# Patient Record
Sex: Female | Born: 1935 | Race: White | Hispanic: No | Smoking: Never smoker
Health system: Southern US, Community
[De-identification: ages and names within clinical notes are randomized; demographics above are authoritative.]

## PROBLEM LIST (undated history)

## (undated) DIAGNOSIS — G8929 Other chronic pain: Secondary | ICD-10-CM

## (undated) DIAGNOSIS — E785 Hyperlipidemia, unspecified: Secondary | ICD-10-CM

## (undated) DIAGNOSIS — I10 Essential (primary) hypertension: Secondary | ICD-10-CM

## (undated) DIAGNOSIS — M549 Dorsalgia, unspecified: Secondary | ICD-10-CM

## (undated) HISTORY — PX: TONSILLECTOMY: SUR1361

## (undated) HISTORY — PX: ABDOMINAL HYSTERECTOMY: SHX81

---

## 2017-09-16 ENCOUNTER — Other Ambulatory Visit: Payer: Self-pay

## 2017-09-16 ENCOUNTER — Emergency Department (HOSPITAL_BASED_OUTPATIENT_CLINIC_OR_DEPARTMENT_OTHER): Payer: Medicare Other

## 2017-09-16 ENCOUNTER — Inpatient Hospital Stay (HOSPITAL_BASED_OUTPATIENT_CLINIC_OR_DEPARTMENT_OTHER)
Admission: EM | Admit: 2017-09-16 | Discharge: 2017-09-18 | DRG: 185 | Disposition: A | Discharge status: Home / Self Care | Payer: Medicare Other | Attending: General Surgery | Admitting: General Surgery

## 2017-09-16 ENCOUNTER — Encounter (HOSPITAL_BASED_OUTPATIENT_CLINIC_OR_DEPARTMENT_OTHER): Payer: Self-pay | Admitting: *Deleted

## 2017-09-16 DIAGNOSIS — W01198A Fall on same level from slipping, tripping and stumbling with subsequent striking against other object, initial encounter: Secondary | ICD-10-CM | POA: Diagnosis present

## 2017-09-16 DIAGNOSIS — Y93H2 Activity, gardening and landscaping: Secondary | ICD-10-CM | POA: Diagnosis not present

## 2017-09-16 DIAGNOSIS — Z881 Allergy status to other antibiotic agents status: Secondary | ICD-10-CM

## 2017-09-16 DIAGNOSIS — Z888 Allergy status to other drugs, medicaments and biological substances status: Secondary | ICD-10-CM

## 2017-09-16 DIAGNOSIS — M549 Dorsalgia, unspecified: Secondary | ICD-10-CM | POA: Diagnosis not present

## 2017-09-16 DIAGNOSIS — S2241XA Multiple fractures of ribs, right side, initial encounter for closed fracture: Principal | ICD-10-CM | POA: Diagnosis present

## 2017-09-16 DIAGNOSIS — D329 Benign neoplasm of meninges, unspecified: Secondary | ICD-10-CM | POA: Diagnosis not present

## 2017-09-16 DIAGNOSIS — M5126 Other intervertebral disc displacement, lumbar region: Secondary | ICD-10-CM | POA: Diagnosis present

## 2017-09-16 DIAGNOSIS — I10 Essential (primary) hypertension: Secondary | ICD-10-CM | POA: Diagnosis present

## 2017-09-16 DIAGNOSIS — S2249XA Multiple fractures of ribs, unspecified side, initial encounter for closed fracture: Secondary | ICD-10-CM | POA: Diagnosis present

## 2017-09-16 DIAGNOSIS — G8929 Other chronic pain: Secondary | ICD-10-CM | POA: Diagnosis not present

## 2017-09-16 DIAGNOSIS — Z79899 Other long term (current) drug therapy: Secondary | ICD-10-CM

## 2017-09-16 DIAGNOSIS — R7303 Prediabetes: Secondary | ICD-10-CM | POA: Diagnosis not present

## 2017-09-16 DIAGNOSIS — Y92096 Garden or yard of other non-institutional residence as the place of occurrence of the external cause: Secondary | ICD-10-CM

## 2017-09-16 DIAGNOSIS — S81812A Laceration without foreign body, left lower leg, initial encounter: Secondary | ICD-10-CM | POA: Diagnosis present

## 2017-09-16 HISTORY — DX: Other chronic pain: G89.29

## 2017-09-16 HISTORY — DX: Hyperlipidemia, unspecified: E78.5

## 2017-09-16 HISTORY — DX: Dorsalgia, unspecified: M54.9

## 2017-09-16 HISTORY — DX: Essential (primary) hypertension: I10

## 2017-09-16 LAB — CBC WITH DIFFERENTIAL/PLATELET
Basophils Absolute: 0 10*3/uL (ref 0.0–0.1)
Basophils Relative: 0 %
EOS ABS: 0.2 10*3/uL (ref 0.0–0.7)
EOS PCT: 2 %
HCT: 44.5 % (ref 36.0–46.0)
Hemoglobin: 14.2 g/dL (ref 12.0–15.0)
LYMPHS ABS: 1.7 10*3/uL (ref 0.7–4.0)
Lymphocytes Relative: 19 %
MCH: 27.6 pg (ref 26.0–34.0)
MCHC: 31.9 g/dL (ref 30.0–36.0)
MCV: 86.6 fL (ref 78.0–100.0)
MONO ABS: 1.1 10*3/uL — AB (ref 0.1–1.0)
Monocytes Relative: 12 %
Neutro Abs: 5.8 10*3/uL (ref 1.7–7.7)
Neutrophils Relative %: 67 %
PLATELETS: 305 10*3/uL (ref 150–400)
RBC: 5.14 MIL/uL — AB (ref 3.87–5.11)
RDW: 15.6 % — AB (ref 11.5–15.5)
WBC: 8.7 10*3/uL (ref 4.0–10.5)

## 2017-09-16 LAB — COMPREHENSIVE METABOLIC PANEL
ALT: 27 U/L (ref 0–44)
ANION GAP: 5 (ref 5–15)
AST: 25 U/L (ref 15–41)
Albumin: 4.5 g/dL (ref 3.5–5.0)
Alkaline Phosphatase: 68 U/L (ref 38–126)
BUN: 25 mg/dL — ABNORMAL HIGH (ref 8–23)
CHLORIDE: 103 mmol/L (ref 98–111)
CO2: 34 mmol/L — AB (ref 22–32)
Calcium: 9.7 mg/dL (ref 8.9–10.3)
Creatinine, Ser: 1.12 mg/dL — ABNORMAL HIGH (ref 0.44–1.00)
GFR, EST AFRICAN AMERICAN: 52 mL/min — AB (ref >60)
GFR, EST NON AFRICAN AMERICAN: 44 mL/min — AB (ref >60)
Glucose, Bld: 95 mg/dL (ref 70–99)
POTASSIUM: 4.7 mmol/L (ref 3.5–5.1)
SODIUM: 142 mmol/L (ref 135–145)
Total Bilirubin: 1.2 mg/dL (ref 0.3–1.2)
Total Protein: 7.8 g/dL (ref 6.5–8.1)

## 2017-09-16 MED ORDER — OXYCODONE HCL 5 MG PO TABS: 2.5000 mg | ORAL_TABLET | ORAL | Status: DC | PRN

## 2017-09-16 MED ORDER — GABAPENTIN 400 MG PO CAPS
400.0000 mg | ORAL_CAPSULE | Freq: Every morning | ORAL | Status: DC
  Administered 2017-09-17 – 2017-09-18 (×2): 400 mg via ORAL
  Filled 2017-09-16 (×2): qty 1

## 2017-09-16 MED ORDER — ONDANSETRON 4 MG PO TBDP: 4.0000 mg | ORAL_TABLET | Freq: Four times a day (QID) | ORAL | Status: DC | PRN

## 2017-09-16 MED ORDER — HYDROCODONE-ACETAMINOPHEN 5-325 MG PO TABS
1.0000 | ORAL_TABLET | Freq: Once | ORAL | Status: AC
  Administered 2017-09-16: 1 via ORAL
  Filled 2017-09-16: qty 1

## 2017-09-16 MED ORDER — DOCUSATE SODIUM 100 MG PO CAPS
100.0000 mg | ORAL_CAPSULE | Freq: Two times a day (BID) | ORAL | Status: DC
  Administered 2017-09-17 – 2017-09-18 (×4): 100 mg via ORAL
  Filled 2017-09-16 (×4): qty 1

## 2017-09-16 MED ORDER — FENTANYL CITRATE (PF) 100 MCG/2ML IJ SOLN
50.0000 ug | Freq: Once | INTRAMUSCULAR | Status: AC
  Administered 2017-09-16: 50 ug via INTRAVENOUS
  Filled 2017-09-16: qty 2

## 2017-09-16 MED ORDER — POTASSIUM CHLORIDE IN NACL 20-0.9 MEQ/L-% IV SOLN
INTRAVENOUS | Status: DC
  Administered 2017-09-16: via INTRAVENOUS
  Filled 2017-09-16: qty 1000

## 2017-09-16 MED ORDER — LISINOPRIL 10 MG PO TABS
10.0000 mg | ORAL_TABLET | Freq: Every day | ORAL | Status: DC
  Administered 2017-09-17 – 2017-09-18 (×2): 10 mg via ORAL
  Filled 2017-09-16 (×2): qty 1

## 2017-09-16 MED ORDER — ENOXAPARIN SODIUM 40 MG/0.4ML ~~LOC~~ SOLN
40.0000 mg | Freq: Every day | SUBCUTANEOUS | Status: DC
  Administered 2017-09-17 – 2017-09-18 (×2): 40 mg via SUBCUTANEOUS
  Filled 2017-09-16 (×3): qty 0.4

## 2017-09-16 MED ORDER — OXYCODONE HCL 5 MG PO TABS: 5.0000 mg | ORAL_TABLET | ORAL | Status: DC | PRN

## 2017-09-16 MED ORDER — FENTANYL CITRATE (PF) 100 MCG/2ML IJ SOLN
50.0000 ug | INTRAMUSCULAR | Status: DC | PRN
  Administered 2017-09-16: 50 ug via INTRAVENOUS
  Filled 2017-09-16: qty 2

## 2017-09-16 MED ORDER — ONDANSETRON HCL 4 MG/2ML IJ SOLN
4.0000 mg | Freq: Once | INTRAMUSCULAR | Status: AC
  Administered 2017-09-16: 4 mg via INTRAVENOUS
  Filled 2017-09-16: qty 2

## 2017-09-16 MED ORDER — ACETAMINOPHEN 325 MG PO TABS
650.0000 mg | ORAL_TABLET | Freq: Four times a day (QID) | ORAL | Status: DC
  Administered 2017-09-17 – 2017-09-18 (×6): 650 mg via ORAL
  Filled 2017-09-16 (×6): qty 2

## 2017-09-16 MED ORDER — IOPAMIDOL (ISOVUE-300) INJECTION 61%
100.0000 mL | Freq: Once | INTRAVENOUS | Status: AC | PRN
  Administered 2017-09-16: 64 mL via INTRAVENOUS

## 2017-09-16 MED ORDER — MORPHINE SULFATE (PF) 2 MG/ML IV SOLN
1.0000 mg | INTRAVENOUS | Status: DC | PRN
  Administered 2017-09-16: 1 mg via INTRAVENOUS
  Filled 2017-09-16: qty 1

## 2017-09-16 MED ORDER — DIPHENHYDRAMINE HCL 50 MG/ML IJ SOLN: 12.5000 mg | Freq: Three times a day (TID) | INTRAMUSCULAR | Status: DC | PRN

## 2017-09-16 MED ORDER — GABAPENTIN 400 MG PO CAPS
800.0000 mg | ORAL_CAPSULE | Freq: Every day | ORAL | Status: DC
  Administered 2017-09-17 (×2): 800 mg via ORAL
  Filled 2017-09-16 (×2): qty 2

## 2017-09-16 MED ORDER — ONDANSETRON HCL 4 MG/2ML IJ SOLN: 4.0000 mg | Freq: Four times a day (QID) | INTRAMUSCULAR | Status: DC | PRN

## 2017-09-16 NOTE — H&P (Signed)
Debbie Sloan is an 84 y.o. female.   Chief Complaint: I fell HPI: 84 year old female with hypertension, hyperlipidemia, chronic back pain transferred from [place] for multiple rib fractures after sustaining a ground-level fall.  She was outside talking with her neighbor and watering her plants and got distracted and tripped.  She fell on her right side.  She did not hit her head or lose consciousness.  She had a small laceration to her left lower leg.  She was unable to get herself up.  She said a neighbor and the neighbor saw her helped her get up and took her to the emergency room for evaluation.  She denies any acute neck pain or back pain.  She does take some gabapentin for her herniated disc.  She states her doctor is trying to wean her gabapentin dosage.  She denies any tobacco, alcohol or drugs.  She denies any abdominal pain, vision change or extremity pain other than tenderness around the laceration.  Steri-Strips were placed over the laceration at the ER.  She was transferred here for observation due to her multiple rib fractures.  She is followed by [facility] for meningioma, lumbar disc herniation  She states that she has had 2 ground-level falls in the past couple months.  She states they were both careless errors.   Past Medical History:  Diagnosis Date  . Chronic back pain   . Hyperlipidemia   . Hypertension   Prediabetes   Past Surgical History:  Procedure Laterality Date  . ABDOMINAL HYSTERECTOMY    . TONSILLECTOMY    EGD with dilation   History reviewed. No pertinent family history. Social History:  reports that she has never smoked. She has never used smokeless tobacco. She reports that she drank alcohol. She reports that she has current or past drug history.  Allergies:  Allergies  Allergen Reactions  . Atorvastatin   . Simvastatin   . Tetracyclines & Related     Medications Prior to Admission  Medication Sig Dispense Refill  . gabapentin (NEURONTIN)  800 MG tablet Take 800 mg by mouth 3 (three) times daily.    Marland Kitchen lisinopril (PRINIVIL,ZESTRIL) 10 MG tablet Take 10 mg by mouth daily.    Marland Kitchen LORazepam (ATIVAN) 0.5 MG tablet Take 0.5 mg by mouth every 8 (eight) hours.      Results for orders placed or performed during the hospital encounter of 09/16/17 (from the past 48 hour(s))  CBC with Differential     Status: Abnormal   Collection Time: 09/16/17  2:54 PM  Result Value Ref Range   WBC 8.7 4.0 - 10.5 K/uL   RBC 5.14 (H) 3.87 - 5.11 MIL/uL   Hemoglobin 14.2 12.0 - 15.0 g/dL   HCT 44.5 36.0 - 46.0 %   MCV 86.6 78.0 - 100.0 fL   MCH 27.6 26.0 - 34.0 pg   MCHC 31.9 30.0 - 36.0 g/dL   RDW 15.6 (H) 11.5 - 15.5 %   Platelets 305 150 - 400 K/uL   Neutrophils Relative % 67 %   Neutro Abs 5.8 1.7 - 7.7 K/uL   Lymphocytes Relative 19 %   Lymphs Abs 1.7 0.7 - 4.0 K/uL   Monocytes Relative 12 %   Monocytes Absolute 1.1 (H) 0.1 - 1.0 K/uL   Eosinophils Relative 2 %   Eosinophils Absolute 0.2 0.0 - 0.7 K/uL   Basophils Relative 0 %   Basophils Absolute 0.0 0.0 - 0.1 K/uL    Comment: Performed at [facility], [address] [place], [place] [address]  Comprehensive metabolic panel     Status: Abnormal   Collection Time: 09/16/17  2:54 PM  Result Value Ref Range   Sodium 142 135 - 145 mmol/L   Potassium 4.7 3.5 - 5.1 mmol/L   Chloride 103 98 - 111 mmol/L   CO2 34 (H) 22 - 32 mmol/L   Glucose, Bld 95 70 - 99 mg/dL   BUN 25 (H) 8 - 23 mg/dL   Creatinine, Ser 1.12 (H) 0.44 - 1.00 mg/dL   Calcium 9.7 8.9 - 10.3 mg/dL   Total Protein 7.8 6.5 - 8.1 g/dL   Albumin 4.5 3.5 - 5.0 g/dL   AST 25 15 - 41 U/L   ALT 27 0 - 44 U/L   Alkaline Phosphatase 68 38 - 126 U/L   Total Bilirubin 1.2 0.3 - 1.2 mg/dL   GFR calc non Af Amer 44 (L) >60 mL/min   GFR calc Af Amer 52 (L) >60 mL/min    Comment: (NOTE) The eGFR has been calculated using the CKD EPI equation. This calculation has not been validated in all clinical situations. eGFR's  persistently <60 mL/min signify possible Chronic Kidney Disease.    Anion gap 5 5 - 15    Comment: Performed at [facility], [place], [place] [address]   Dg Ribs Unilateral W/chest Right  Result Date: 09/16/2017 CLINICAL DATA:  Right rib pain after fall. EXAM: RIGHT RIBS AND CHEST - 3+ VIEW COMPARISON:  None. FINDINGS: Mildly displaced fractures are seen involving the lateral portions of the right fifth and sixth ribs. There is no evidence of pneumothorax or pleural effusion. Both lungs are clear. Heart size and mediastinal contours are within normal limits. IMPRESSION: Mildly displaced right fifth and sixth rib fractures. No acute cardiopulmonary abnormality is noted. Electronically Signed   By: Marijo Conception, M.D.   On: 09/16/2017 13:48   Ct Chest W Contrast  Result Date: 09/16/2017 CLINICAL DATA:  Right rib fractures on chest radiographs from earlier today. Fall today. EXAM: CT CHEST WITH CONTRAST TECHNIQUE: Multidetector CT imaging of the chest was performed during intravenous contrast administration. CONTRAST:  28m ISOVUE-300 IOPAMIDOL (ISOVUE-300) INJECTION 61% COMPARISON:  Chest radiograph from earlier today. FINDINGS: Cardiovascular: Normal heart size. No significant pericardial effusion/thickening. Left anterior descending coronary atherosclerosis. Atherosclerotic nonaneurysmal thoracic aorta. Dilated main pulmonary artery (3.5 cm diameter). No central pulmonary emboli. Mediastinum/Nodes: No discrete thyroid nodules. Unremarkable esophagus. No pathologically enlarged axillary, mediastinal or hilar lymph nodes. Lungs/Pleura: No pneumothorax. No pleural effusion. Subcentimeter calcified left lower lobe granuloma. Subpleural 4 mm right middle lobe solid pulmonary nodule associated with the minor fissure (series 3/image 85). Small parenchymal bands at the right greater than left lung bases, which could represent hypoventilatory change and/or  postinfectious/postinflammatory scarring. Upper abdomen: Simple left liver lobe cysts, largest 2.6 cm. Musculoskeletal: No aggressive appearing focal osseous lesions. There are acute fractures of the lateral right fourth, fifth, sixth, seventh, eighth, ninth and tenth ribs, which are mildly displaced in the fifth and sixth ribs. Mild thoracic spondylosis. IMPRESSION: 1. Acute fractures of the lateral right fourth through tenth ribs. No pneumothorax. No pleural effusions. 2. Small parenchymal bands at the right greater than left lung bases, which could represent hypoventilatory change and/or nonspecific scarring. 3. Dilated main pulmonary artery, suggesting pulmonary arterial hypertension 4. One vessel coronary atherosclerosis. Aortic Atherosclerosis (ICD10-I70.0). Electronically Signed   By: JIlona SorrelM.D.   On:  09/16/2017 16:33    Review of Systems  Constitutional: Negative for weight loss.  HENT: Negative for nosebleeds.   Eyes: Negative for blurred vision.  Respiratory: Negative for shortness of breath.   Cardiovascular: Positive for chest pain (chest wall pain). Negative for palpitations, orthopnea and PND.       Denies DOE  Gastrointestinal: Negative for abdominal pain and vomiting.  Genitourinary: Negative for dysuria and hematuria.  Musculoskeletal: Positive for back pain (herniated disc) and falls (2 previous falls in past several months).  Skin: Negative for itching and rash.  Neurological: Negative for dizziness, focal weakness, seizures, loss of consciousness and headaches.       Denies TIAs, amaurosis fugax  Endo/Heme/Allergies: Does not bruise/bleed easily.  Psychiatric/Behavioral: The patient is not nervous/anxious.     Blood pressure (!) 143/91, pulse 62, temperature 98.3 F (36.8 C), temperature source Oral, resp. rate 18, height 5' 4"  (1.626 m), weight 64.9 kg, SpO2 97 %. Physical Exam  Vitals reviewed. Constitutional: She is oriented to person, place, and time. She  appears well-developed and well-nourished. No distress.  HENT:  Head: Normocephalic and atraumatic.  Right Ear: External ear normal.  Left Ear: External ear normal.  Eyes: Conjunctivae and EOM are normal. No scleral icterus.  Neck: Normal range of motion. Neck supple. No tracheal deviation present. No thyromegaly present.  Cardiovascular: Normal rate, normal heart sounds and intact distal pulses.  No murmur heard. Respiratory: Effort normal and breath sounds normal. No stridor. No respiratory distress. She has no wheezes. She exhibits tenderness.  Able to pull 1400-1500 on IS  GI: Soft. She exhibits no distension. There is no tenderness. There is no rebound.  Musculoskeletal: She exhibits no edema or tenderness.  Lymphadenopathy:    She has no cervical adenopathy.  Neurological: She is alert and oriented to person, place, and time. She exhibits normal muscle tone.  Skin: Skin is warm and dry. No rash noted. She is not diaphoretic. No erythema. No pallor.  Bandaged Left lower leg shin - steristrips over lac.   Psychiatric: She has a normal mood and affect. Her behavior is normal. Judgment and thought content normal.     Assessment/Plan Fall Right rib fxs 4-10 HTN Chronic back pain/lumbar disc herniation Prediabetes HPL  Discussed rib fx and mgmt with pt Pain control - multimodality pain control - scheduled tylenol, cont home gabapentin, PRN oxycodone, breakthru morphine Cont home BP med Pharm to verify meds pulm toilet - IS, OOB, ambulate PT/OT consult in AM  Leighton Ruff. Redmond Pulling, MD, FACS General, Bariatric, & Minimally Invasive Surgery [facility], [place]   Greer Pickerel, MD 09/16/2017, 11:23 PM

## 2017-09-16 NOTE — ED Triage Notes (Signed)
Pt c/o fall this am on bricks , c/o right rib pain and left lower leg abrasion .

## 2017-09-16 NOTE — ED Notes (Signed)
[facility] arrived to transport [facility] to [facility] 

## 2017-09-16 NOTE — ED Provider Notes (Signed)
[facility] EMERGENCY DEPARTMENT Provider Note   CSN: [id number] Arrival date & time: 09/16/17  1314     History   Chief Complaint Chief Complaint  Patient presents with  . Fall    HPI Debbie Sloan is a 84 y.o. female.  HPI Debbie Sloan is a 84 y.o. female presents to emergency department with complaint of a fall.  Patient states she was watering her flowers this morning, when she tripped and fell hitting her right ribs on a brick retaining wall.  She did not hit her head or lost consciousness.  She denies any other injuries other than laceration to the left lower leg.  She states her next door neighbor saw her fall and helped her get up and brought her here.  Leg wound was cleaned and Band-Aid applied.  She states pain in the right ribs is worse with movement and taking deep breath.  She denies any abdominal pain.  No neck or back pain.  No numbness or weakness in extremities.  Able to ambulate with no difficulty.  Did not take any medications prior to coming in  Past Medical History:  Diagnosis Date  . Chronic back pain   . Hyperlipidemia   . Hypertension     There are no active problems to display for this patient.   Past Surgical History:  Procedure Laterality Date  . ABDOMINAL HYSTERECTOMY    . TONSILLECTOMY       OB History   None      Home Medications    Prior to Admission medications   Medication Sig Start Date End Date Taking? Authorizing Provider  gabapentin (NEURONTIN) 800 MG tablet Take 800 mg by mouth 3 (three) times daily.   Yes [provider]  lisinopril (PRINIVIL,ZESTRIL) 10 MG tablet Take 10 mg by mouth daily.   Yes [provider]  LORazepam (ATIVAN) 0.5 MG tablet Take 0.5 mg by mouth every 8 (eight) hours.   Yes [provider]    Family History History reviewed. No pertinent family history.  Social History Social History   Tobacco Use  . Smoking status: Never Smoker  . Smokeless tobacco: Never  Used  Substance Use Topics  . Alcohol use: Not Currently  . Drug use: Not Currently     Allergies   Atorvastatin; Simvastatin; and Tetracyclines & related   Review of Systems Review of Systems  Constitutional: Negative for chills and fever.  Respiratory: Negative for cough, chest tightness and shortness of breath.   Cardiovascular: Positive for chest pain. Negative for palpitations and leg swelling.  Gastrointestinal: Negative for abdominal pain, diarrhea, nausea and vomiting.  Genitourinary: Negative for dysuria, flank pain, pelvic pain, vaginal bleeding, vaginal discharge and vaginal pain.  Musculoskeletal: Negative for arthralgias, myalgias, neck pain and neck stiffness.  Skin: Negative for rash.  Neurological: Negative for dizziness, weakness and headaches.  All other systems reviewed and are negative.    Physical Exam Updated Vital Signs BP (!) 141/57 (BP Location: Left Arm)   Pulse 64   Temp 98.4 F (36.9 C) (Oral)   Resp 18   Ht 5\' 4"  (1.626 m)   Wt 64.9 kg   SpO2 98%   BMI 24.55 kg/m   Physical Exam  Constitutional: She appears well-developed and well-nourished. No distress.  HENT:  Head: Normocephalic.  Eyes: Conjunctivae are normal.  Neck: Neck supple.  Cardiovascular: Normal rate, regular rhythm and normal heart sounds.  Pulmonary/Chest: Effort normal and breath sounds normal. No respiratory distress. She  has no wheezes. She has no rales. She exhibits tenderness.  Right lower posterior, lateral, anterior rib tenderness.  No deformity, no bruising, no flail chest, no crepitus  Abdominal: Soft. Bowel sounds are normal. She exhibits no distension. There is no tenderness. There is no rebound.  Musculoskeletal: She exhibits no edema.  2cm laceration to the left anterior tib fib. Hemostatic.   Neurological: She is alert.  Skin: Skin is warm and dry.  Psychiatric: She has a normal mood and affect. Her behavior is normal.  Nursing note and vitals  reviewed.    ED Treatments / Results  Labs (all labs ordered are listed, but only abnormal results are displayed) Labs Reviewed - No data to display  EKG None  Radiology Dg Ribs Unilateral W/chest Right  Result Date: 09/16/2017 CLINICAL DATA:  Right rib pain after fall. EXAM: RIGHT RIBS AND CHEST - 3+ VIEW COMPARISON:  None. FINDINGS: Mildly displaced fractures are seen involving the lateral portions of the right fifth and sixth ribs. There is no evidence of pneumothorax or pleural effusion. Both lungs are clear. Heart size and mediastinal contours are within normal limits. IMPRESSION: Mildly displaced right fifth and sixth rib fractures. No acute cardiopulmonary abnormality is noted. Electronically Signed   By: Marijo Conception, M.D.   On: 09/16/2017 13:48    Procedures Procedures (including critical care time)  Medications Ordered in ED Medications  fentaNYL (SUBLIMAZE) injection 50 mcg (has no administration in time range)  HYDROcodone-acetaminophen (NORCO/VICODIN) 5-325 MG per tablet 1 tablet (1 tablet Oral Given 09/16/17 1543)  iopamidol (ISOVUE-300) 61 % injection 100 mL (64 mLs Intravenous Contrast Given 09/16/17 1556)  fentaNYL (SUBLIMAZE) injection 50 mcg (50 mcg Intravenous Given 09/16/17 1707)  ondansetron (ZOFRAN) injection 4 mg (4 mg Intravenous Given 09/16/17 1707)     Initial Impression / Assessment and Plan / ED Course  I have reviewed the triage vital signs and the nursing notes.  Pertinent labs & imaging results that were available during my care of the patient were reviewed by me and considered in my medical decision making (see chart for details).     Pt after a mechanical fall, mainly complaining of right lower rib injury. Xray showing 5th and 6th rib fractures. Will get labs and ct for further evaluation.   Pt's CT showed ribs 4-10 fractures. Pt continues to be neurovascularly intact. Comfortable with fentanyl. Spoke with Dr. Grandville Silos with trauma, accepted pt  for admission to trauma service.   Vitals:   09/16/17 1715 09/16/17 1730 09/16/17 1745 09/16/17 1900  BP: (!) 174/62 (!) 173/60 (!) 165/64 (!) 135/46  Pulse: 74 75 77 68  Resp: 16  16 18   Temp:      TempSrc:      SpO2: 100% 97% 98% 100%  Weight:      Height:         Final Clinical Impressions(s) / ED Diagnoses   Final diagnoses:  Closed fracture of multiple ribs of right side, initial encounter    ED Discharge Orders    None       Jeannett Senior, PA-C 09/16/17 1900    Malvin Johns, MD 09/17/17 [phone]

## 2017-09-17 ENCOUNTER — Inpatient Hospital Stay (HOSPITAL_COMMUNITY): Payer: Medicare Other

## 2017-09-17 ENCOUNTER — Encounter (HOSPITAL_COMMUNITY): Payer: Self-pay | Admitting: *Deleted

## 2017-09-17 DIAGNOSIS — S2241XA Multiple fractures of ribs, right side, initial encounter for closed fracture: Secondary | ICD-10-CM | POA: Diagnosis not present

## 2017-09-17 LAB — BASIC METABOLIC PANEL
ANION GAP: 8 (ref 5–15)
BUN: 23 mg/dL (ref 8–23)
CALCIUM: 9.1 mg/dL (ref 8.9–10.3)
CO2: 25 mmol/L (ref 22–32)
CREATININE: 1.27 mg/dL — AB (ref 0.44–1.00)
Chloride: 105 mmol/L (ref 98–111)
GFR, EST AFRICAN AMERICAN: 44 mL/min — AB (ref >60)
GFR, EST NON AFRICAN AMERICAN: 38 mL/min — AB (ref >60)
GLUCOSE: 103 mg/dL — AB (ref 70–99)
Potassium: 4.5 mmol/L (ref 3.5–5.1)
Sodium: 138 mmol/L (ref 135–145)

## 2017-09-17 LAB — CBC
HEMATOCRIT: 39.7 % (ref 36.0–46.0)
Hemoglobin: 12.2 g/dL (ref 12.0–15.0)
MCH: 27.1 pg (ref 26.0–34.0)
MCHC: 30.7 g/dL (ref 30.0–36.0)
MCV: 88.2 fL (ref 78.0–100.0)
Platelets: 268 10*3/uL (ref 150–400)
RBC: 4.5 MIL/uL (ref 3.87–5.11)
RDW: 14.9 % (ref 11.5–15.5)
WBC: 9.9 10*3/uL (ref 4.0–10.5)

## 2017-09-17 MED ORDER — POLYETHYLENE GLYCOL 3350 17 G PO PACK: 17.0000 g | PACK | Freq: Every day | ORAL | Status: DC | PRN

## 2017-09-17 NOTE — Progress Notes (Signed)
[facility] Progress Note     Subjective: CC-  Sitting up in bed. States that she feels much better than yesterday. Denies SOB. Pulling 1500 on IS. Has only needed Tylenol thus far this morning for pain control. Asking when she can go home.  Lives home alone.  This is her 3rd fall since July.  Objective: Vital signs in last 24 hours: Temp:  [98 F (36.7 C)-98.4 F (36.9 C)] 98 F (36.7 C) (08/20 0500) Pulse Rate:  [60-77] 60 (08/20 0500) Resp:  [14-18] 16 (08/20 0500) BP: (121-177)/(46-91) 121/77 (08/20 0500) SpO2:  [97 %-100 %] 99 % (08/20 0500) Weight:  [64.9 kg] 64.9 kg (08/19 1319) Last BM Date: 09/14/17  Intake/Output from previous day: 08/19 0701 - 08/20 0700 In: 240 [P.O.:240] Out: -  Intake/Output this shift: No intake/output data recorded.  PE: Gen:  Alert, NAD, pleasant HEENT: EOM's intact, pupils equal and round Card:  RRR, 2+ DP pulses Pulm:  CTAB, no W/R/R, effort normal, pulling 1500 on IS Abd: Soft, NT, mild distension, +BS Ext:  Calves soft and nontender without edema Psych: A&Ox3  Skin: no rashes noted, warm and dry  Lab Results:  Recent Labs    09/16/17 1454 09/17/17 0421  WBC 8.7 9.9  HGB 14.2 12.2  HCT 44.5 39.7  PLT 305 268   BMET Recent Labs    09/16/17 1454 09/17/17 0421  NA 142 138  K 4.7 4.5  CL 103 105  CO2 34* 25  GLUCOSE 95 103*  BUN 25* 23  CREATININE 1.12* 1.27*  CALCIUM 9.7 9.1   PT/INR No results for input(s): LABPROT, INR in the last 72 hours. CMP     Component Value Date/Time   NA 138 09/17/2017 0421   K 4.5 09/17/2017 0421   CL 105 09/17/2017 0421   CO2 25 09/17/2017 0421   GLUCOSE 103 (H) 09/17/2017 0421   BUN 23 09/17/2017 0421   CREATININE 1.27 (H) 09/17/2017 0421   CALCIUM 9.1 09/17/2017 0421   PROT 7.8 09/16/2017 1454   ALBUMIN 4.5 09/16/2017 1454   AST 25 09/16/2017 1454   ALT 27 09/16/2017 1454   ALKPHOS 68 09/16/2017 1454   BILITOT 1.2 09/16/2017 1454   GFRNONAA 38 (L)  09/17/2017 0421   GFRAA 44 (L) 09/17/2017 0421   Lipase  No results found for: LIPASE     Studies/Results: Dg Ribs Unilateral W/chest Right  Result Date: 09/16/2017 CLINICAL DATA:  Right rib pain after fall. EXAM: RIGHT RIBS AND CHEST - 3+ VIEW COMPARISON:  None. FINDINGS: Mildly displaced fractures are seen involving the lateral portions of the right fifth and sixth ribs. There is no evidence of pneumothorax or pleural effusion. Both lungs are clear. Heart size and mediastinal contours are within normal limits. IMPRESSION: Mildly displaced right fifth and sixth rib fractures. No acute cardiopulmonary abnormality is noted. Electronically Signed   By: Marijo Conception, M.D.   On: 09/16/2017 13:48   Ct Chest W Contrast  Result Date: 09/16/2017 CLINICAL DATA:  Right rib fractures on chest radiographs from earlier today. Fall today. EXAM: CT CHEST WITH CONTRAST TECHNIQUE: Multidetector CT imaging of the chest was performed during intravenous contrast administration. CONTRAST:  54mL ISOVUE-300 IOPAMIDOL (ISOVUE-300) INJECTION 61% COMPARISON:  Chest radiograph from earlier today. FINDINGS: Cardiovascular: Normal heart size. No significant pericardial effusion/thickening. Left anterior descending coronary atherosclerosis. Atherosclerotic nonaneurysmal thoracic aorta. Dilated main pulmonary artery (3.5 cm diameter). No central pulmonary emboli. Mediastinum/Nodes: No discrete thyroid nodules. Unremarkable esophagus. No  pathologically enlarged axillary, mediastinal or hilar lymph nodes. Lungs/Pleura: No pneumothorax. No pleural effusion. Subcentimeter calcified left lower lobe granuloma. Subpleural 4 mm right middle lobe solid pulmonary nodule associated with the minor fissure (series 3/image 85). Small parenchymal bands at the right greater than left lung bases, which could represent hypoventilatory change and/or postinfectious/postinflammatory scarring. Upper abdomen: Simple left liver lobe cysts, largest 2.6  cm. Musculoskeletal: No aggressive appearing focal osseous lesions. There are acute fractures of the lateral right fourth, fifth, sixth, seventh, eighth, ninth and tenth ribs, which are mildly displaced in the fifth and sixth ribs. Mild thoracic spondylosis. IMPRESSION: 1. Acute fractures of the lateral right fourth through tenth ribs. No pneumothorax. No pleural effusions. 2. Small parenchymal bands at the right greater than left lung bases, which could represent hypoventilatory change and/or nonspecific scarring. 3. Dilated main pulmonary artery, suggesting pulmonary arterial hypertension 4. One vessel coronary atherosclerosis. Aortic Atherosclerosis (ICD10-I70.0). Electronically Signed   By: Ilona Sorrel M.D.   On: 09/16/2017 16:33   Dg Chest Port 1 View  Result Date: 09/17/2017 CLINICAL DATA:  Multiple right rib fractures EXAM: PORTABLE CHEST 1 VIEW COMPARISON:  Chest CT scan of September 16, 2017 FINDINGS: The lungs are well-expanded. There is no pneumothorax or pleural effusion. There are fractures of the lateral aspects of the right fourth, fifth, and 6 ribs. No left rib fractures are observed. The heart is top-normal in size. The pulmonary vascularity is normal. There is calcification in the wall of the thoracic aorta. There is gaseous distention of the stomach. IMPRESSION: Multiple lateral right rib fractures. No pneumothorax, pleural effusion, or pulmonary contusion. Top-normal cardiac size without pulmonary vascular congestion. Thoracic aortic atherosclerosis. Moderate gaseous distention of the stomach. Electronically Signed   By: David  Martinique M.D.   On: 09/17/2017 08:47    Anti-infectives: Anti-infectives (From admission, onward)   None       Assessment/Plan Fall Right rib fxs 4-10 - pain control and pulmonary toilet, f/u CXR stable without PNX AKI - Cr 1.27, increase IVF and repeat BMP in AM HTN - home meds Chronic back pain/lumbar disc herniation Prediabetes HPL - home meds  ID -  none FEN - increase IVF, HH diet VTE - SCDs, lovenox Foley - none  Plan - PT/OT and pain control.    LOS: 1 day    Wellington Hampshire , [facility] 09/17/2017, 9:34 AM Pager: [phone] Consults: [phone] Mon 7:00 am -11:30 AM Tues-Fri 7:00 am-4:30 pm Sat-Sun 7:00 am-11:30 am

## 2017-09-17 NOTE — Evaluation (Signed)
Occupational Therapy Evaluation Patient Details Name: Debbie Sloan MRN: [id number] DOB: 1933-02-05 Today's Date: 09/17/2017    History of Present Illness Pt is an 84 y.o. female who was admitted after Debbie fall sustaining R rib fractures 4-10. She has Debbie PMH significant for chronic back pain, hyperlipidemia, hypertension, and prediabetes.    Clinical Impression   PTA, pt was independent with ADL and functional mobility. She currently requires min guard assist for tub shower transfers and supervision for toilet transfers and LB ADL. She presents with some anxious behaviors concerning mobility and return home but she is able to demonstrate functional cognition this session. Pt with Debbie number of recent falls at home and lives alone. She would benefit from continued OT services while admitted to improve independence and safety with ADL and functional mobility prior to returning home. Pt would benefit from initial supervision at home to maximize safety.     Follow Up Recommendations  No OT follow up;Supervision/Assistance - 24 hour    Equipment Recommendations  3 in 1 bedside commode    Recommendations for Other Services       Precautions / Restrictions Precautions Precautions: Fall Restrictions Weight Bearing Restrictions: No      Mobility Bed Mobility Overal bed mobility: Needs Assistance Bed Mobility: Rolling;Sidelying to Sit;Sit to Sidelying Rolling: Supervision Sidelying to sit: Min assist     Sit to sidelying: Min assist General bed mobility comments: Assist to manage BLE.  Transfers Overall transfer level: Needs assistance   Transfers: Sit to/from Stand Sit to Stand: Supervision         General transfer comment: Supervision for basic sit<>stand.     Balance Overall balance assessment: No apparent balance deficits (not formally assessed)                                         ADL either performed or assessed with clinical judgement   ADL Overall  ADL's : Needs assistance/impaired Eating/Feeding: Set up;Sitting   Grooming: Supervision/safety;Standing   Upper Body Bathing: Set up   Lower Body Bathing: Supervison/ safety;Sit to/from stand   Upper Body Dressing : Set up;Sitting   Lower Body Dressing: Supervision/safety;Sit to/from stand   Toilet Transfer: Supervision/safety;Ambulation;Regular Toilet;Grab bars Toilet Transfer Details (indicate cue type and reason): pt pushing IV pole Toileting- Clothing Manipulation and Hygiene: Supervision/safety;Sit to/from stand   Tub/ Shower Transfer: Min guard;Ambulation;Shower seat   Functional mobility during ADLs: Min guard General ADL Comments: Pt with some instability and noted pain/"funny feeling" in back of L knee during simulated tub/shower transfer.      Vision Patient Visual Report: No change from baseline Vision Assessment?: No apparent visual deficits     Perception     Praxis      Pertinent Vitals/Pain Pain Assessment: Faces Faces Pain Scale: Hurts even more Pain Location: R ribs Pain Descriptors / Indicators: Aching;Discomfort;Guarding Pain Intervention(s): Limited activity within patient's tolerance;Monitored during session;Repositioned     Hand Dominance     Extremity/Trunk Assessment Upper Extremity Assessment Upper Extremity Assessment: Overall WFL for tasks assessed   Lower Extremity Assessment Lower Extremity Assessment: Overall WFL for tasks assessed       Communication Communication Communication: No difficulties   Cognition Arousal/Alertness: Awake/alert Behavior During Therapy: Anxious Overall Cognitive Status: Within Functional Limits for tasks assessed  General Comments: Pt speaks quickly and requires cues to slow down throughout.   General Comments       Exercises     Shoulder Instructions      Home Living Family/patient expects to be discharged to:: Private residence Living  Arrangements: Alone Available Help at Discharge: Family;Friend(s);Available PRN/intermittently Type of Home: House Home Access: Stairs to enter [employer] of Steps: 2   Home Layout: One level     Bathroom Shower/Tub: Teacher, early years/pre: Standard     Home Equipment: None          Prior Functioning/Environment Level of Independence: Independent                 OT Problem List: Decreased activity tolerance;Decreased safety awareness;Decreased knowledge of use of DME or AE;Decreased knowledge of precautions;Impaired balance (sitting and/or standing);Pain      OT Treatment/Interventions: Self-care/ADL training;Therapeutic exercise;Energy conservation;DME and/or AE instruction;Therapeutic activities;Patient/family education;Balance training    OT Goals(Current goals can be found in the care plan section) Acute Rehab OT Goals Patient Stated Goal: to go home today OT Goal Formulation: With patient Time For Goal Achievement: 10/01/17 Potential to Achieve Goals: Good ADL Goals Pt Will Perform Grooming: sitting;Independently Pt Will Perform Lower Body Dressing: Independently;sit to/from stand Pt Will Transfer to Toilet: Independently;ambulating;regular height toilet Pt Will Perform Toileting - Clothing Manipulation and hygiene: Independently;sit to/from stand Pt Will Perform Tub/Shower Transfer: Tub transfer;Independently;3 in 1;ambulating Additional ADL Goal #1: Pt will verbalize 3 strategies to prevent falls in her home environment.  OT Frequency: Min 2X/week   Barriers to D/C: Decreased caregiver support          Co-evaluation              AM-PAC PT "6 Clicks" Daily Activity     Outcome Measure Help from another person eating meals?: None Help from another person taking care of personal grooming?: None Help from another person toileting, which includes using toliet, bedpan, or urinal?: Debbie Little Help from another person bathing  (including washing, rinsing, drying)?: Debbie Little Help from another person to put on and taking off regular upper body clothing?: None Help from another person to put on and taking off regular lower body clothing?: None 6 Click Score: 22   End of Session Equipment Utilized During Treatment: Gait belt;Rolling walker Nurse Communication: Mobility status  Activity Tolerance: Patient tolerated treatment well Patient left: in chair;with call bell/phone within reach  OT Visit Diagnosis: Other abnormalities of gait and mobility (R26.89);Pain Pain - Right/Left: Right Pain - part of body: (ribs)                Time: 5284-1324 OT Time Calculation (min): 37 min Charges:  OT General Charges $OT Visit: 1 Visit OT Evaluation $OT Eval Moderate Complexity: 1 Mod OT Treatments $Self Care/Home Management : 8-22 mins  Norman Herrlich, MS OTR/L  Pager: [place] Debbie Sloan 09/17/2017, 4:12 PM

## 2017-09-17 NOTE — Plan of Care (Signed)
  Problem: Coping: Goal: Level of anxiety will decrease Outcome: Progressing   Problem: Pain Managment: Goal: General experience of comfort will improve Outcome: Progressing   

## 2017-09-17 NOTE — Evaluation (Signed)
Physical Therapy Evaluation Patient Details Name: Debbie Sloan MRN: [id number] DOB: 03-04-1933 Today's Date: 09/17/2017   History of Present Illness  Pt is an 84 y.o. female who was admitted after a fall sustaining R rib fractures 4-10. She has a PMH significant for chronic back pain, hyperlipidemia, hypertension, and prediabetes.   Clinical Impression  PTA pt independent with mobility and all aspects of daily living, however pt has experienced 3 falls in the last several months. Pt limited in safe mobility by decreased safety awareness and rib and shoulder pain. Pt currently requires min guard for bed mobility, supervision for transfers and min guard for ambulation of 600 feet without AD. PT recommends outpatient PT to address shoulder pain with movement as well as balance and safety awareness to address multiple recent falls. PT will continue to follow acutely.    Follow Up Recommendations Outpatient PT    Equipment Recommendations  None recommended by PT    Recommendations for Other Services       Precautions / Restrictions Precautions Precautions: Fall Restrictions Weight Bearing Restrictions: No      Mobility  Bed Mobility Overal bed mobility: Needs Assistance Bed Mobility: Rolling;Sidelying to Sit;Sit to Sidelying Rolling: Supervision       Sit to sidelying: Min guard General bed mobility comments: min guard for safety, able to manage LE into bed  Transfers Overall transfer level: Needs assistance   Transfers: Sit to/from Stand Sit to Stand: Supervision         General transfer comment: Supervision for basic sit<>stand.   Ambulation/Gait Ambulation/Gait assistance: Min guard Gait Distance (Feet): 600 Feet Assistive device: None Gait Pattern/deviations: Step-through pattern;Drifts right/left Gait velocity: slowed Gait velocity interpretation: 1.31 - 2.62 ft/sec, indicative of limited community ambulator General Gait Details: min guard for safety, mild  instability that manifests as drifting in the hallway, no overt loss of balance        Balance Overall balance assessment: History of Falls;Mild deficits observed, not formally tested                                           Pertinent Vitals/Pain Pain Assessment: Faces Faces Pain Scale: Hurts little more Pain Location: R ribs Pain Descriptors / Indicators: Aching;Discomfort;Guarding Pain Intervention(s): Limited activity within patient's tolerance;Monitored during session;Repositioned    Home Living Family/patient expects to be discharged to:: Private residence Living Arrangements: Alone Available Help at Discharge: Family;Friend(s);Available PRN/intermittently Type of Home: House Home Access: Stairs to enter   [employer] of Steps: 2 Home Layout: One level Home Equipment: None      Prior Function Level of Independence: Independent                  Extremity/Trunk Assessment   Upper Extremity Assessment Upper Extremity Assessment: RUE deficits/detail RUE Deficits / Details: R rotator cuff injury lacking full ROM, strength grossly 3/5    Lower Extremity Assessment Lower Extremity Assessment: Overall WFL for tasks assessed       Communication   Communication: No difficulties  Cognition Arousal/Alertness: Awake/alert Behavior During Therapy: Anxious Overall Cognitive Status: Within Functional Limits for tasks assessed                                 General Comments: Pt easily distracted and tangental in her conversation  General Comments General comments (skin integrity, edema, etc.): VSS        Assessment/Plan    PT Assessment Patient needs continued PT services  PT Problem List Decreased balance;Decreased mobility;Decreased safety awareness       PT Treatment Interventions DME instruction;Gait training;Stair training;Functional mobility training;Therapeutic activities;Therapeutic exercise;Balance  training;Cognitive remediation;Patient/family education    PT Goals (Current goals can be found in the Care Plan section)  Acute Rehab PT Goals Patient Stated Goal: to go home today PT Goal Formulation: With patient Time For Goal Achievement: 10/01/17 Potential to Achieve Goals: Good    Frequency Min 3X/week   Barriers to discharge Decreased caregiver support         AM-PAC PT "6 Clicks" Daily Activity  Outcome Measure Difficulty turning over in bed (including adjusting bedclothes, sheets and blankets)?: Unable Difficulty moving from lying on back to sitting on the side of the bed? : A Little Difficulty sitting down on and standing up from a chair with arms (e.g., wheelchair, bedside commode, etc,.)?: A Little Help needed moving to and from a bed to chair (including a wheelchair)?: A Little Help needed walking in hospital room?: A Little Help needed climbing 3-5 steps with a railing? : A Little 6 Click Score: 16    End of Session   Activity Tolerance: Patient tolerated treatment well Patient left: in bed;with call bell/phone within reach(EoB eating dinner) Nurse Communication: Mobility status;Other (comment)(heat pack for R scapula ) PT Visit Diagnosis: Repeated falls (R29.6);History of falling (Z91.81);Other abnormalities of gait and mobility (R26.89)    Time: 2585-2778 PT Time Calculation (min) (ACUTE ONLY): 29 min   Charges:   PT Evaluation $PT Eval Low Complexity: 1 Low PT Treatments $Gait Training: 8-22 mins        Oliana Gowens B. Migdalia Dk PT, DPT Acute Rehabilitation  [phone] Pager [phone]    [place] 09/17/2017, 5:04 PM

## 2017-09-18 DIAGNOSIS — S2241XA Multiple fractures of ribs, right side, initial encounter for closed fracture: Secondary | ICD-10-CM | POA: Diagnosis not present

## 2017-09-18 LAB — BASIC METABOLIC PANEL
ANION GAP: 6 (ref 5–15)
BUN: 19 mg/dL (ref 8–23)
CALCIUM: 8.9 mg/dL (ref 8.9–10.3)
CO2: 26 mmol/L (ref 22–32)
CREATININE: 1.1 mg/dL — AB (ref 0.44–1.00)
Chloride: 107 mmol/L (ref 98–111)
GFR calc Af Amer: 53 mL/min — ABNORMAL LOW (ref >60)
GFR, EST NON AFRICAN AMERICAN: 45 mL/min — AB (ref >60)
Glucose, Bld: 106 mg/dL — ABNORMAL HIGH (ref 70–99)
Potassium: 4.6 mmol/L (ref 3.5–5.1)
Sodium: 139 mmol/L (ref 135–145)

## 2017-09-18 NOTE — Progress Notes (Signed)
Occupational Therapy Treatment Patient Details Name: Debbie Sloan MRN: [id number] DOB: 03/22/1933 Today's Date: 09/18/2017    History of present illness Pt is an 84 y.o. female who was admitted after a fall sustaining R rib fractures 4-10. She has a PMH significant for chronic back pain, hyperlipidemia, hypertension, and prediabetes.    OT comments  Pt demonstrating good progress toward OT goals this session. Educated pt concerning cognitive strategies to decrease distractions to improve short term memory and safety in her home environment. Completed Short Blessed Test cognitive screening with pt scoring 0, which falls in the normal range of 0-4. She did continue to demonstrate difficulty recalling instructions from beginning to end of session but was able to complete delayed recall section of cognitive screening without difficulty. She was able to complete tub/shower transfer with min guard assist today and reported pulling pain in back of LLE with this activity. Notified PA. Recommend pt utilize walk-in shower rather than tub and use 3-in-1 for shower seat. OT will continue to follow while admitted.    Follow Up Recommendations  No OT follow up;Supervision/Assistance - 24 hour    Equipment Recommendations  3 in 1 bedside commode    Recommendations for Other Services      Precautions / Restrictions Precautions Precautions: Fall Restrictions Weight Bearing Restrictions: No       Mobility Bed Mobility               General bed mobility comments: OOB ambulating in room on my arrival.   Transfers Overall transfer level: Needs assistance   Transfers: Sit to/from Stand Sit to Stand: Supervision         General transfer comment: Supervision for basic sit<>stand.     Balance Overall balance assessment: History of Falls;Mild deficits observed, not formally tested                                         ADL either performed or assessed with clinical  judgement   ADL Overall ADL's : Needs assistance/impaired Eating/Feeding: Set up;Sitting               Upper Body Dressing : Set up;Sitting       Toilet Transfer: Supervision/safety;Ambulation;Regular Toilet;Grab bars       Tub/ Shower Transfer: Min guard;Ambulation;3 in 1   Functional mobility during ADLs: Supervision/safety General ADL Comments: Pt sharing with OT that she has a walk-in shower as well as tub shower. Recommended that pt use walk-in shower to improve safety. Recommend 3-in-1 to use as shower seat for stability. Noted pain in posterior aspect of L knee during transfer. Notified PA.      Vision   Vision Assessment?: No apparent visual deficits   Perception     Praxis      Cognition Arousal/Alertness: Awake/alert Behavior During Therapy: Anxious Overall Cognitive Status: Within Functional Limits for tasks assessed                                 General Comments: Completed Short Blessed Test with pt scoring 0 which falls in the normal range of 0-4. She does have some decreased functional short term memory with difficulty recalling instructions from beginning to end of session but she was able to complete delayed recall task on cognitive screening without difficulty.  Exercises     Shoulder Instructions       General Comments Pt with telemetry box alarming with tachycardic HR of 140. However, noted that brown and red leads were switched on pt. Corrected leads with HR reading 75-80. Notified RN.     Pertinent Vitals/ Pain       Pain Assessment: Faces Faces Pain Scale: Hurts little more Pain Location: R ribs Pain Descriptors / Indicators: Aching;Discomfort;Guarding Pain Intervention(s): Limited activity within patient's tolerance;Monitored during session;Repositioned  Home Living                                          Prior Functioning/Environment              Frequency  Min 2X/week         Progress Toward Goals  OT Goals(current goals can now be found in the care plan section)  Progress towards OT goals: Progressing toward goals  Acute Rehab OT Goals Patient Stated Goal: to feel better OT Goal Formulation: With patient Time For Goal Achievement: 10/01/17 Potential to Achieve Goals: Good ADL Goals Pt Will Perform Grooming: sitting;Independently Pt Will Perform Lower Body Dressing: Independently;sit to/from stand Pt Will Transfer to Toilet: Independently;ambulating;regular height toilet Pt Will Perform Toileting - Clothing Manipulation and hygiene: Independently;sit to/from stand Pt Will Perform Tub/Shower Transfer: Tub transfer;Independently;3 in 1;ambulating Additional ADL Goal #1: Pt will verbalize 3 strategies to prevent falls in her home environment.  Plan Discharge plan remains appropriate    Co-evaluation                 AM-PAC PT "6 Clicks" Daily Activity     Outcome Measure   Help from another person eating meals?: None Help from another person taking care of personal grooming?: None Help from another person toileting, which includes using toliet, bedpan, or urinal?: None Help from another person bathing (including washing, rinsing, drying)?: A Little Help from another person to put on and taking off regular upper body clothing?: None Help from another person to put on and taking off regular lower body clothing?: None 6 Click Score: 23    End of Session Equipment Utilized During Treatment: Gait belt;Rolling walker  OT Visit Diagnosis: Other abnormalities of gait and mobility (R26.89);Pain Pain - Right/Left: Right Pain - part of body: Arm(ribs)   Activity Tolerance Patient tolerated treatment well   Patient Left in chair;with call bell/phone within reach   Nurse Communication Mobility status        Time: 3748-2707 OT Time Calculation (min): 24 min  Charges: OT General Charges $OT Visit: 1 Visit OT Treatments $Self Care/Home  Management : 23-37 mins  Norman Herrlich, MS OTR/L  Pager: [place] 09/18/2017, 9:55 AM

## 2017-09-18 NOTE — Progress Notes (Addendum)
Physical Therapy Treatment Patient Details Name: Debbie Sloan MRN: [id number] DOB: [birth year] Today's Date: 09/18/2017    History of Present Illness Pt is an 84 y.o. female who was admitted after a fall sustaining R rib fractures 4-10. She has a PMH significant for chronic back pain, hyperlipidemia, hypertension, and prediabetes.     PT Comments    Pt making steady progress with functional mobility. Pt would continue to benefit from skilled physical therapy services at this time while admitted and after d/c to address the below listed limitations in order to improve overall safety and independence with functional mobility.    Follow Up Recommendations  Outpatient PT     Equipment Recommendations  None recommended by PT    Recommendations for Other Services       Precautions / Restrictions Precautions Precautions: Fall Restrictions Weight Bearing Restrictions: No    Mobility  Bed Mobility Overal bed mobility: Needs Assistance Bed Mobility: Supine to Sit     Supine to sit: Supervision     General bed mobility comments: HOB max elevated; supervision for safety, increased time and use of bed rail  Transfers Overall transfer level: Needs assistance Equipment used: None Transfers: Sit to/from Stand Sit to Stand: Supervision         General transfer comment: supervision for safety  Ambulation/Gait Ambulation/Gait assistance: Min guard;Supervision Gait Distance (Feet): 400 Feet Assistive device: None Gait Pattern/deviations: Step-through pattern;Drifts right/left Gait velocity: decreased Gait velocity interpretation: 1.31 - 2.62 ft/sec, indicative of limited community ambulator General Gait Details: min guard for safety with directional changes and head turns, supervision for straight path even surfaces   Stairs Stairs: Yes Stairs assistance: Min guard Stair Management: One rail Right;One rail Left;Step to pattern;Forwards Number of Stairs: 2(x2  trials) General stair comments: no instability or LOB, min guard for safety   Wheelchair Mobility    Modified Rankin (Stroke Patients Only)       Balance Overall balance assessment: History of Falls;Mild deficits observed, not formally tested                                          Cognition Arousal/Alertness: Awake/alert Behavior During Therapy: WFL for tasks assessed/performed Overall Cognitive Status: Within Functional Limits for tasks assessed                                 General Comments: Completed Short Blessed Test with pt scoring 0 which falls in the normal range of 0-4. She does have some decreased functional short term memory with difficulty recalling instructions from beginning to end of session but she was able to complete delayed recall task on cognitive screening without difficulty.       Exercises      General Comments General comments (skin integrity, edema, etc.): Pt with telemetry box alarming with tachycardic HR of 140. However, noted that brown and red leads were switched on pt. Corrected leads with HR reading 75-80. Notified RN.       Pertinent Vitals/Pain Pain Assessment: Faces Faces Pain Scale: Hurts little more Pain Location: R ribs Pain Descriptors / Indicators: Aching;Discomfort;Guarding Pain Intervention(s): Monitored during session;Repositioned    Home Living                      Prior Function  PT Goals (current goals can now be found in the care plan section) Acute Rehab PT Goals Patient Stated Goal: to feel better PT Goal Formulation: With patient Time For Goal Achievement: 10/01/17 Potential to Achieve Goals: Good Progress towards PT goals: Progressing toward goals    Frequency    Min 3X/week      PT Plan Current plan remains appropriate    Co-evaluation              AM-PAC PT "6 Clicks" Daily Activity  Outcome Measure  Difficulty turning over in bed  (including adjusting bedclothes, sheets and blankets)?: A Little Difficulty moving from lying on back to sitting on the side of the bed? : Unable Difficulty sitting down on and standing up from a chair with arms (e.g., wheelchair, bedside commode, etc,.)?: A Little Help needed moving to and from a bed to chair (including a wheelchair)?: None Help needed walking in hospital room?: None Help needed climbing 3-5 steps with a railing? : A Little 6 Click Score: 18    End of Session   Activity Tolerance: Patient tolerated treatment well Patient left: in bed;with call bell/phone within reach Nurse Communication: Mobility status PT Visit Diagnosis: Repeated falls (R29.6);History of falling (Z91.81);Other abnormalities of gait and mobility (R26.89)     Time: 1005-1036 PT Time Calculation (min) (ACUTE ONLY): 31 min  Charges:  $Gait Training: 8-22 mins $Therapeutic Activity: 8-22 mins                     [place], [place], [place] 09/18/2017, 11:28 AM

## 2017-09-18 NOTE — Discharge Instructions (Signed)

## 2017-09-18 NOTE — Discharge Summary (Signed)
[facility] Discharge Summary   Patient ID: Debbie Sloan MRN: [id number] DOB/AGE: Jun 18, 1933 84 y.o.  Admit date: 09/16/2017 Discharge date: 09/18/2017  Admitting Diagnosis: Fall Right rib fxs 4-10 HTN Chronic back pain/lumbar disc herniation Prediabetes HPL  Discharge Diagnosis Patient Active Problem List   Diagnosis Date Noted  . Multiple rib fractures 09/16/2017    Consultants None  Imaging: Dg Ribs Unilateral W/chest Right  Result Date: 09/16/2017 CLINICAL DATA:  Right rib pain after fall. EXAM: RIGHT RIBS AND CHEST - 3+ VIEW COMPARISON:  None. FINDINGS: Mildly displaced fractures are seen involving the lateral portions of the right fifth and sixth ribs. There is no evidence of pneumothorax or pleural effusion. Both lungs are clear. Heart size and mediastinal contours are within normal limits. IMPRESSION: Mildly displaced right fifth and sixth rib fractures. No acute cardiopulmonary abnormality is noted. Electronically Signed   By: Marijo Conception, M.D.   On: 09/16/2017 13:48   Ct Chest W Contrast  Result Date: 09/16/2017 CLINICAL DATA:  Right rib fractures on chest radiographs from earlier today. Fall today. EXAM: CT CHEST WITH CONTRAST TECHNIQUE: Multidetector CT imaging of the chest was performed during intravenous contrast administration. CONTRAST:  46mL ISOVUE-300 IOPAMIDOL (ISOVUE-300) INJECTION 61% COMPARISON:  Chest radiograph from earlier today. FINDINGS: Cardiovascular: Normal heart size. No significant pericardial effusion/thickening. Left anterior descending coronary atherosclerosis. Atherosclerotic nonaneurysmal thoracic aorta. Dilated main pulmonary artery (3.5 cm diameter). No central pulmonary emboli. Mediastinum/Nodes: No discrete thyroid nodules. Unremarkable esophagus. No pathologically enlarged axillary, mediastinal or hilar lymph nodes. Lungs/Pleura: No pneumothorax. No pleural effusion. Subcentimeter calcified left lower lobe granuloma.  Subpleural 4 mm right middle lobe solid pulmonary nodule associated with the minor fissure (series 3/image 85). Small parenchymal bands at the right greater than left lung bases, which could represent hypoventilatory change and/or postinfectious/postinflammatory scarring. Upper abdomen: Simple left liver lobe cysts, largest 2.6 cm. Musculoskeletal: No aggressive appearing focal osseous lesions. There are acute fractures of the lateral right fourth, fifth, sixth, seventh, eighth, ninth and tenth ribs, which are mildly displaced in the fifth and sixth ribs. Mild thoracic spondylosis. IMPRESSION: 1. Acute fractures of the lateral right fourth through tenth ribs. No pneumothorax. No pleural effusions. 2. Small parenchymal bands at the right greater than left lung bases, which could represent hypoventilatory change and/or nonspecific scarring. 3. Dilated main pulmonary artery, suggesting pulmonary arterial hypertension 4. One vessel coronary atherosclerosis. Aortic Atherosclerosis (ICD10-I70.0). Electronically Signed   By: Ilona Sorrel M.D.   On: 09/16/2017 16:33   Dg Chest Port 1 View  Result Date: 09/17/2017 CLINICAL DATA:  Multiple right rib fractures EXAM: PORTABLE CHEST 1 VIEW COMPARISON:  Chest CT scan of September 16, 2017 FINDINGS: The lungs are well-expanded. There is no pneumothorax or pleural effusion. There are fractures of the lateral aspects of the right fourth, fifth, and 6 ribs. No left rib fractures are observed. The heart is top-normal in size. The pulmonary vascularity is normal. There is calcification in the wall of the thoracic aorta. There is gaseous distention of the stomach. IMPRESSION: Multiple lateral right rib fractures. No pneumothorax, pleural effusion, or pulmonary contusion. Top-normal cardiac size without pulmonary vascular congestion. Thoracic aortic atherosclerosis. Moderate gaseous distention of the stomach. Electronically Signed   By: David  Martinique M.D.   On: 09/17/2017 08:47     Procedures None  Hospital Course:  Debbie Sloan is an 84 yo female who was transferred from [place] to [facility] 8/19 after sustaining a ground-level fall.  She was  outside talking with her neighbor and watering her plants and got distracted and tripped.  She fell on her right side.  She did not hit her head or lose consciousness.  She had a small laceration to her left lower leg.  She was unable to get herself up.  She said a neighbor and the neighbor saw her helped her get up and took her to the emergency room for evaluation.  She denies any acute neck pain or back pain.  Workup showed multiple right rib fractures.  Patient was admitted to the trauma service for monitoring, pain control, and pulmonary toilet. Follow up chest xray remained stable without pneumothorax.  Patient worked with therapies during this admission. On 8/21, the patient was voiding well, tolerating diet, ambulating well, pain well controlled, vital signs stable and felt stable for discharge home with outpatient PT.  Patient will follow up as below and knows to call with questions or concerns.     Physical Exam: Gen:  Alert, NAD, pleasant HEENT: EOM's intact, pupils equal and round Card:  RRR, 2+ DP pulses Pulm:  CTAB, no W/R/R, effort normal, pulling 1250 on IS Abd: Soft, NT, mild distension, +BS Ext:  Calves soft and nontender without edema. Small cdi laceration to anterior left lower leg with steri strips in place, no erythema or drainage Psych: A&Ox3  Skin: no rashes noted, warm and dry  Allergies as of 09/18/2017      Reactions   Demeclocycline Anaphylaxis   Tetracyclines & Related Anaphylaxis   unknown   Atorvastatin    Other reaction(s): Other (See Comments) Muscle ache Muscle ache Muscle ache   Desvenlafaxine    Other reaction(s): Other (See Comments) INSOMNIA unknown INSOMNIA Nervous feeling   Simvastatin    Other reaction(s): Other (See Comments) unknown Joint aches and pain Muscle  ache      Medication List    TAKE these medications   acetaminophen 500 MG tablet Commonly known as:  TYLENOL Take 1,000 mg by mouth every 6 (six) hours as needed for mild pain.   ALPRAZolam 0.5 MG tablet Commonly known as:  XANAX Take 0.25 mg by mouth daily as needed for anxiety.   aspirin EC 81 MG tablet Take 81 mg by mouth at bedtime.   Biotin 5000 MCG Tabs Take 10,000 mcg by mouth daily.   Fish Oil 1000 MG Caps Take 1,000 mg by mouth at bedtime.   gabapentin 800 MG tablet Commonly known as:  NEURONTIN Take 400-800 mg by mouth See admin instructions. Take 1/2 tablet (400mg ) in the morning, and 1 tablet (800mg ) in the evening   lisinopril 10 MG tablet Commonly known as:  PRINIVIL,ZESTRIL Take 10 mg by mouth daily.   magnesium oxide 400 MG tablet Commonly known as:  MAG-OX Take 400 mg by mouth at bedtime.   pravastatin 80 MG tablet Commonly known as:  PRAVACHOL Take 80 mg by mouth every evening.   ranitidine 150 MG tablet Commonly known as:  ZANTAC Take 150 mg by mouth 2 (two) times daily.   tiZANidine 4 MG tablet Commonly known as:  ZANAFLEX Take 2 mg by mouth at bedtime as needed for cramping.   Vitamin C 500 MG Chew Chew 500 mg by mouth daily.   Vitamin D3 1000 units Caps Take 1,000 Units by mouth 2 (two) times daily.            Durable Medical Equipment  (From admission, onward)         Start  Ordered   09/18/17 0849  For home use only DME 3 n 1  Once     09/18/17 0848           Follow-up Information    [facility]. Call.   Why:  as needed, you do not have to schedule an appointment Contact information: [address] [place] [id number] [phone]       Drake Leach, MD. Call in 3 week(s).   Specialty:  Internal Medicine Why:  to arrange post-hospitalization follow up appointment Contact information: [address] [place] [address] [phone]            Signed: Wellington Hampshire, [facility] 09/18/2017, 9:23 AM Pager: [phone] Consults: [phone] Mon 7:00 am -11:30 AM Tues-Fri 7:00 am-4:30 pm Sat-Sun 7:00 am-11:30 am

## 2017-09-18 NOTE — Progress Notes (Signed)
CSW completed SBIRT with patient. Patients states she has had several falls over the past year. She states all of  her falls were contributed to being clumbsy and not paying attention. She states she was watering her plants when her most recent fall happen. Patient states she was talking with her neighbor, wasn't paying attention and fell. Patients states when she was younger, she had a step-mother that was a heavy drinker which influenced her not to drink. Patient states she lives alone but has the support of her neighbors family and friends.   Thurmond Butts, [place] Social Worker [phone]

## 2017-09-19 NOTE — Care Management Note (Signed)
Case Management Note  Patient Details  Name: Semone Orlov MRN: [id number] Date of Birth: 19-Feb-1933  Subjective/Objective:  Pt is an 84 y.o. female who was admitted after a fall sustaining R rib fractures 4-10.  PTA, pt independent, lives alone.                    Action/Plan: PT recommending OP rehab, 3 in 1 [facility].  PA placed referral for OP rehab; referral to [facility] for DME needs.    Expected Discharge Date:  09/18/17               Expected Discharge Plan:     In-House Referral:     Discharge planning Services     Post Acute Care Choice:    Choice offered to:     DME Arranged:   3 in 1 [facility] DME Agency:    [place]:    [facility] Agency:     Status of Service:   Completed, will sign off  If discussed at Long Length of Stay Meetings, dates discussed:    Additional Comments:  Reinaldo Raddle, RN, BSN  Trauma/Neuro ICU Case Manager [phone]

## 2019-09-30 IMAGING — CT CT CHEST W/ CM
2 of 3 series · 15 of 36 positions shown, 18 images · IV contrast (iopamidol)
Comparison: Chest radiograph from earlier today.

CLINICAL DATA: Right rib fractures on chest radiographs from
earlier today. Fall today.

EXAM:
CT CHEST WITH CONTRAST
TECHNIQUE: Multidetector CT imaging of the chest was performed during
intravenous contrast administration.
CONTRAST:  64mL [id number] IOPAMIDOL ([id number]) INJECTION 61%

[Series 2: axial st · axial · 0.64mm/px · z∈[-339,-37]mm · 12 of 179 slices shown, 15 images]
[im 14/179  mediastinal]
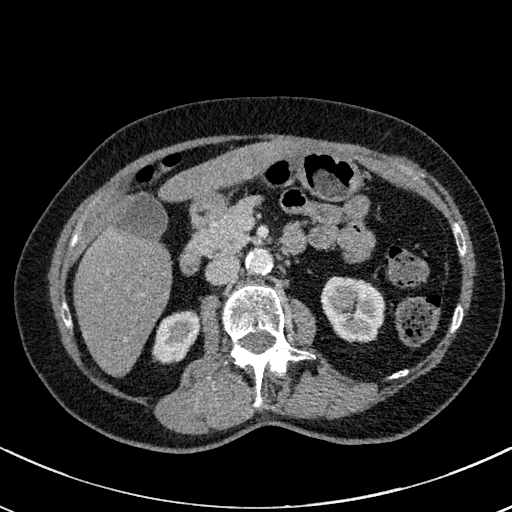
[im 14/179  lung]
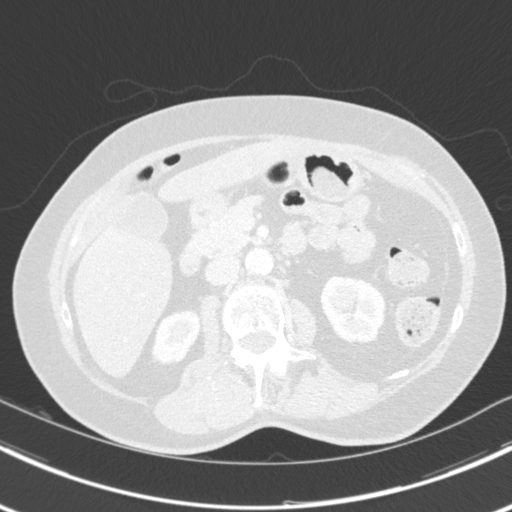
[im 27/179  lung]
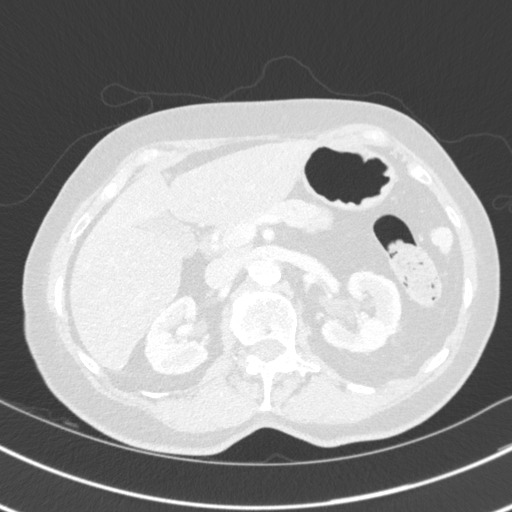
[im 40/179  lung]
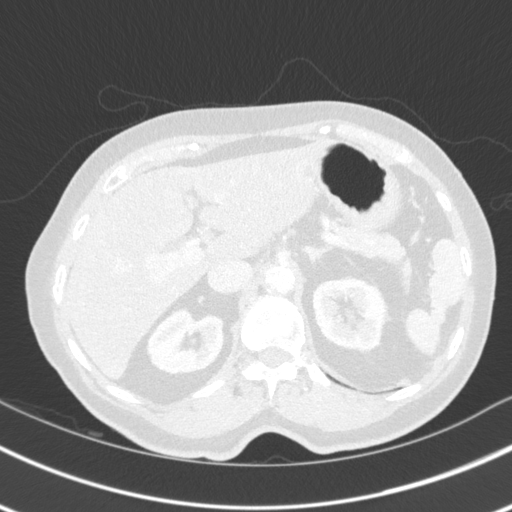
[im 53/179  lung]
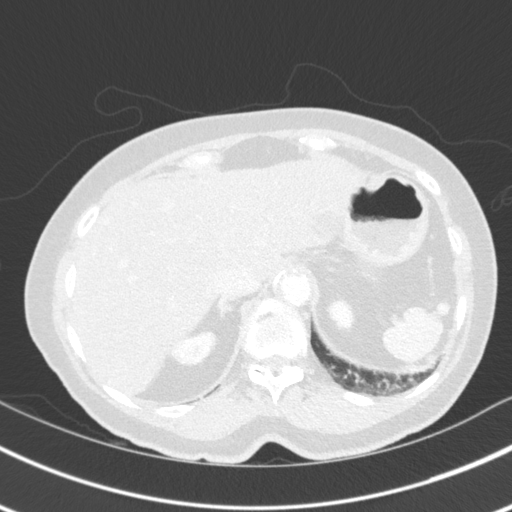
[im 66/179  mediastinal]
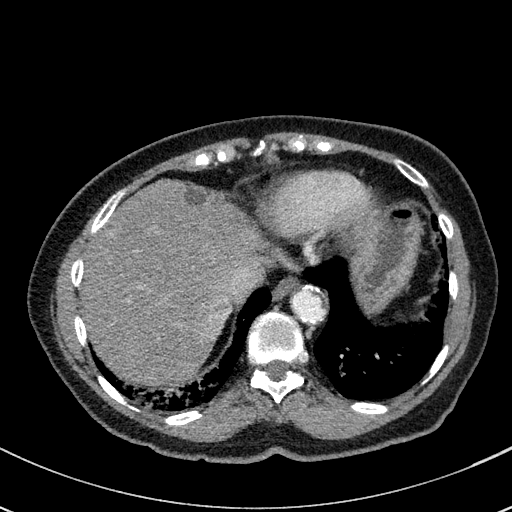
[im 66/179  lung]
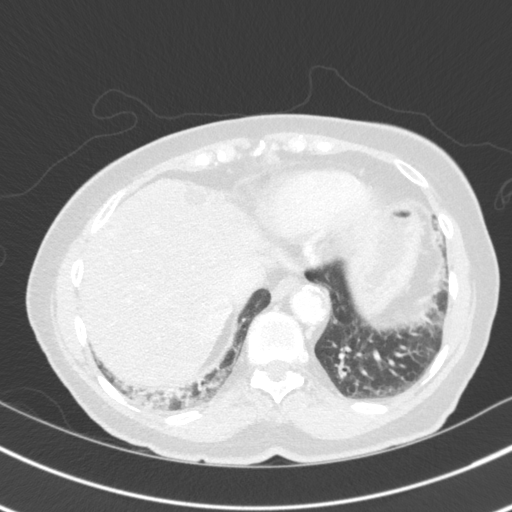
[im 80/179  lung]
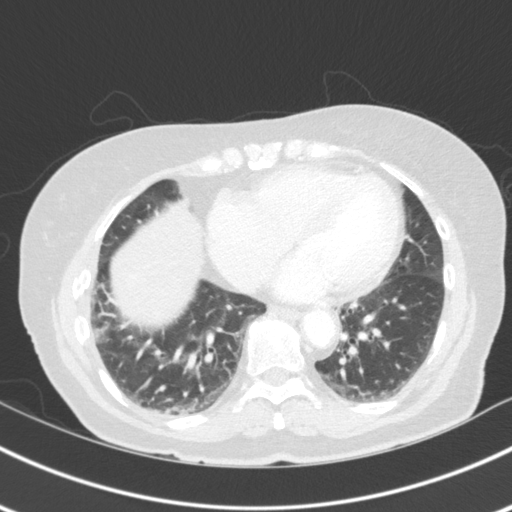
[im 99/179  lung]
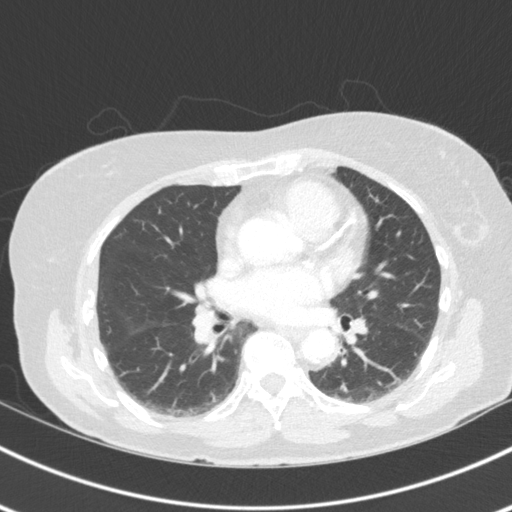
[im 113/179  lung]
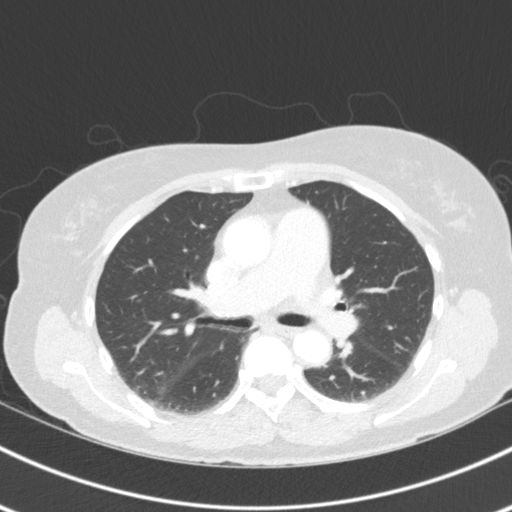
[im 126/179  mediastinal]
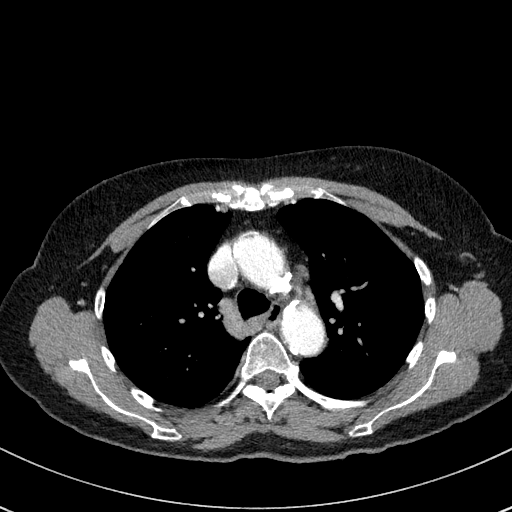
[im 126/179  lung]
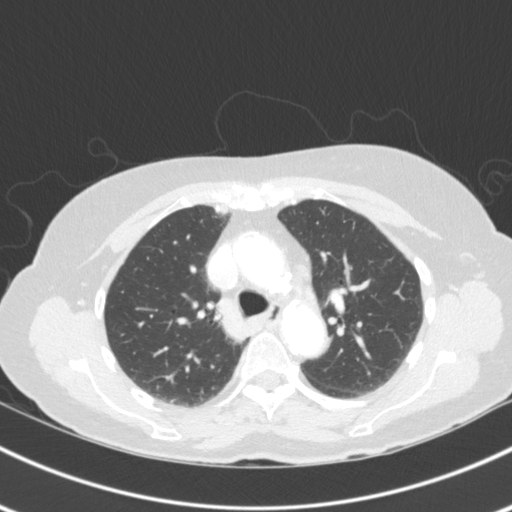
[im 139/179  lung]
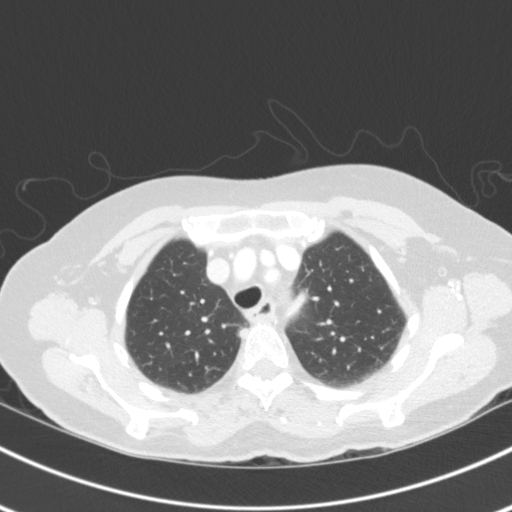
[im 152/179  lung]
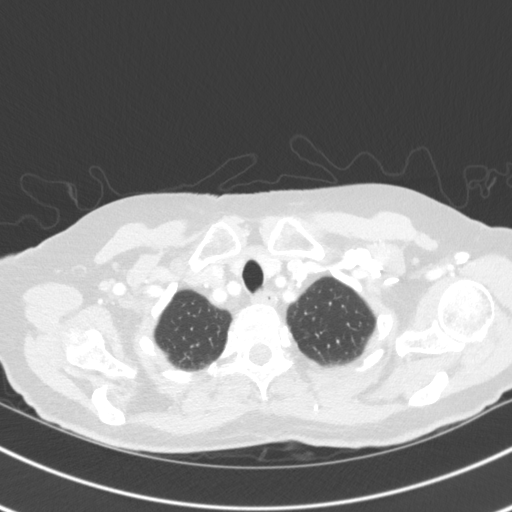
[im 165/179  lung]
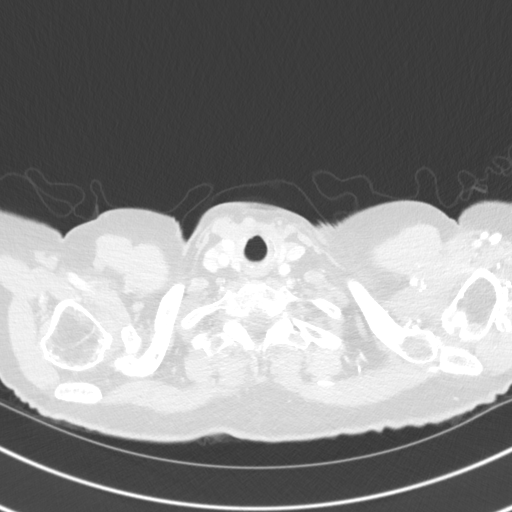

[Series 5: coronal · coronal · 0.66mm/px · 3 of 121 slices shown]
[im 25/121  lung]
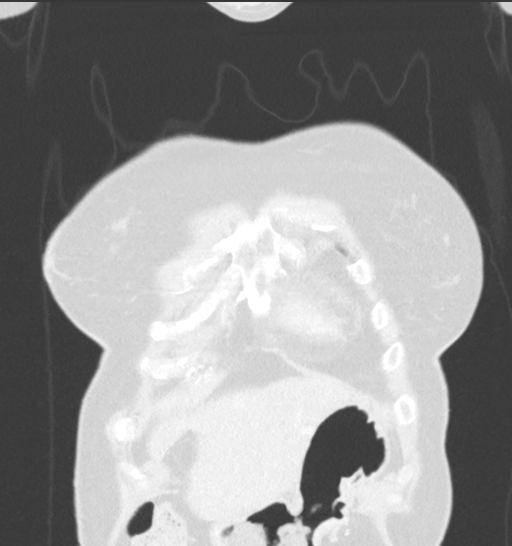
[im 49/121  lung]
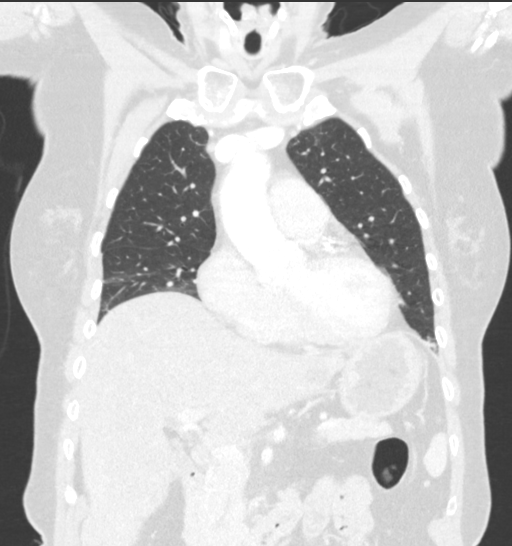
[im 73/121  lung]
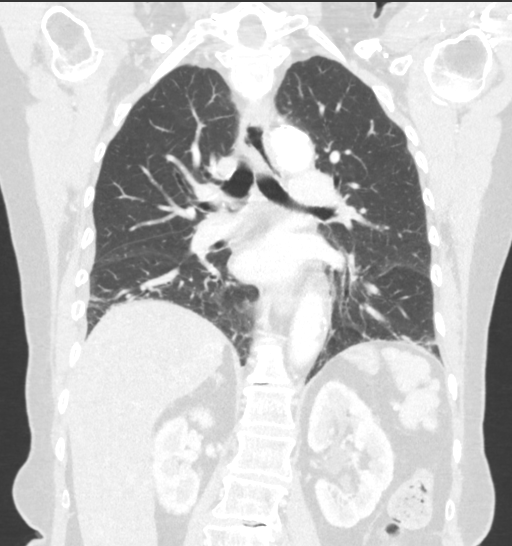

[15 of 36 positions shown; findings below may reference images not displayed]

FINDINGS: Cardiovascular: Normal heart size. No significant pericardial
effusion/thickening. Left anterior descending coronary
atherosclerosis. Atherosclerotic nonaneurysmal thoracic aorta.
Dilated main pulmonary artery (3.5 cm diameter). No central
pulmonary emboli.

Mediastinum/Nodes: No discrete thyroid nodules. Unremarkable
esophagus. No pathologically enlarged axillary, mediastinal or hilar
lymph nodes.

Lungs/Pleura: No pneumothorax. No pleural effusion. Subcentimeter
calcified left lower lobe granuloma. Subpleural 4 mm right middle
lobe solid pulmonary nodule associated with the minor fissure
(series 3/image 85). Small parenchymal bands at the right greater
than left lung bases, which could represent hypoventilatory change
and/or postinfectious/postinflammatory scarring.

Upper abdomen: Simple left liver lobe cysts, largest 2.6 cm.

Musculoskeletal: No aggressive appearing focal osseous lesions.
There are acute fractures of the lateral right fourth, fifth, sixth,
seventh, eighth, ninth and tenth ribs, which are mildly displaced in
the fifth and sixth ribs. Mild thoracic spondylosis.
IMPRESSION: 1. Acute fractures of the lateral right fourth through tenth ribs.
No pneumothorax. No pleural effusions.
2. Small parenchymal bands at the right greater than left lung
bases, which could represent hypoventilatory change and/or
nonspecific scarring.
3. Dilated main pulmonary artery, suggesting pulmonary arterial
hypertension
4. One vessel coronary atherosclerosis.

Aortic Atherosclerosis ([id number]).

## 2022-05-14 ENCOUNTER — Emergency Department (HOSPITAL_BASED_OUTPATIENT_CLINIC_OR_DEPARTMENT_OTHER): Payer: Medicare HMO

## 2022-05-14 ENCOUNTER — Other Ambulatory Visit: Payer: Self-pay

## 2022-05-14 ENCOUNTER — Emergency Department (HOSPITAL_BASED_OUTPATIENT_CLINIC_OR_DEPARTMENT_OTHER)
Admission: EM | Admit: 2022-05-14 | Discharge: 2022-05-14 | Disposition: A | Discharge status: Home / Self Care | Payer: Medicare HMO | Attending: Emergency Medicine | Admitting: Emergency Medicine

## 2022-05-14 DIAGNOSIS — Z7982 Long term (current) use of aspirin: Secondary | ICD-10-CM | POA: Diagnosis not present

## 2022-05-14 DIAGNOSIS — I10 Essential (primary) hypertension: Secondary | ICD-10-CM | POA: Diagnosis not present

## 2022-05-14 DIAGNOSIS — S4992XA Unspecified injury of left shoulder and upper arm, initial encounter: Secondary | ICD-10-CM | POA: Diagnosis present

## 2022-05-14 DIAGNOSIS — W010XXA Fall on same level from slipping, tripping and stumbling without subsequent striking against object, initial encounter: Secondary | ICD-10-CM | POA: Insufficient documentation

## 2022-05-14 DIAGNOSIS — D329 Benign neoplasm of meninges, unspecified: Secondary | ICD-10-CM | POA: Diagnosis not present

## 2022-05-14 DIAGNOSIS — Z79899 Other long term (current) drug therapy: Secondary | ICD-10-CM | POA: Insufficient documentation

## 2022-05-14 DIAGNOSIS — W19XXXA Unspecified fall, initial encounter: Secondary | ICD-10-CM

## 2022-05-14 MED ORDER — NAPROXEN 375 MG PO TABS: 375.0000 mg | ORAL_TABLET | Freq: Two times a day (BID) | ORAL | 0 refills | Status: DC

## 2022-05-14 NOTE — ED Triage Notes (Addendum)
Pt fell and hit left shoulder on carpet.  Pt does not want any pain meds at this time

## 2022-05-14 NOTE — ED Provider Notes (Signed)
[facility] Provider Note   CSN: [id number] Arrival date & time: 05/14/22  1937     History  Chief Complaint  Patient presents with   Shoulder Injury    Debbie Sloan is a 89 y.o. female.   Shoulder Injury     89 year old female with medical history significant for HLD, HTN, chronic back pain who presents to the emergency department after a ground-level mechanical fall.  The patient states that she was trying to put her pants on when she lost her balance and fell onto her left shoulder.  She denies any loss of consciousness.  She not clearly hit her head from what she can remember but is not entirely clear.  She is not on anticoagulation.  She states that she has had pain in her left shoulder with range of motion since the fall.  She is worried she may have injured her shoulder.  She is not taking anything for pain.  She arrives GCS 15, ABC intact.  Home Medications Prior to Admission medications   Medication Sig Start Date End Date Taking? Authorizing Provider  naproxen (NAPROSYN) 375 MG tablet Take 1 tablet (375 mg total) by mouth 2 (two) times daily. 05/14/22  Yes Ernie Avena, MD  acetaminophen (TYLENOL) 500 MG tablet Take 1,000 mg by mouth every 6 (six) hours as needed for mild pain.    [provider]  ALPRAZolam Prudy Feeler) 0.5 MG tablet Take 0.25 mg by mouth daily as needed for anxiety. 07/09/17   [provider]  Ascorbic Acid (VITAMIN C) 500 MG CHEW Chew 500 mg by mouth daily.    [provider]  aspirin EC 81 MG tablet Take 81 mg by mouth at bedtime.     [provider]  Biotin 5000 MCG TABS Take 10,000 mcg by mouth daily.    [provider]  Cholecalciferol (VITAMIN D3) 1000 units CAPS Take 1,000 Units by mouth 2 (two) times daily.     [provider]  gabapentin (NEURONTIN) 800 MG tablet Take 400-800 mg by mouth See admin instructions. Take 1/2 tablet ( ) in the morning,  and 1 tablet ( ) in the evening    [provider]  lisinopril (PRINIVIL,ZESTRIL) 10 MG tablet Take 10 mg by mouth daily.    [provider]  magnesium oxide (MAG-OX) 400 MG tablet Take 400 mg by mouth at bedtime.     [provider]  Omega-3 Fatty Acids (FISH OIL) 1000 MG CAPS Take 1,000 mg by mouth at bedtime.     [provider]  pravastatin (PRAVACHOL) 80 MG tablet Take 80 mg by mouth every evening.  07/10/17   [provider]  ranitidine (ZANTAC) 150 MG tablet Take 150 mg by mouth 2 (two) times daily. 07/25/17   [provider]  tiZANidine (ZANAFLEX) 4 MG tablet Take 2 mg by mouth at bedtime as needed for cramping. 07/27/14   [provider]      Allergies    Demeclocycline, Tetracyclines & related, Atorvastatin, Desvenlafaxine, Lisinopril, Simvastatin, and Sulfa antibiotics    Review of Systems   Review of Systems  Musculoskeletal:  Positive for arthralgias.  All other systems reviewed and are negative.   Physical Exam Updated Vital Signs BP (!) 184/95   Pulse 82   Temp 99.3 F (37.4 C) (Oral)   Resp 18   Ht  (1.626 m)   Wt 65.3 kg   SpO2 97%   BMI 24.72 kg/m  Physical Exam Vitals and nursing note reviewed.  Constitutional:      General: She is not in acute distress.    Appearance: She is well-developed.     Comments: GCS 15, ABC intact  HENT:     Head: Normocephalic and atraumatic.  Eyes:     Extraocular Movements: Extraocular movements intact.     Conjunctiva/sclera: Conjunctivae normal.     Pupils: Pupils are equal, round, and reactive to light.  Neck:     Comments: Mid midline TTP of the cervical spine  Range of motion intact Cardiovascular:     Rate and Rhythm: Normal rate and regular rhythm.  Pulmonary:     Effort: Pulmonary effort is normal. No respiratory distress.     Breath sounds: Normal breath sounds.  Chest:     Comments: Clavicles stable nontender to AP compression.  Chest wall  stable and nontender to AP and lateral compression. Abdominal:     Palpations: Abdomen is soft.     Tenderness: There is no abdominal tenderness.     Comments: Pelvis stable to lateral compression  Musculoskeletal:     Cervical back: Neck supple.     Comments: No midline tenderness to palpation of the thoracic or lumbar spine.  Extremities atraumatic with intact range of motion with the exception of pain with range of motion of the left shoulder, no AC joint tenderness, intact passive range of motion although pain with empty can test, 2+ radial pulses bilaterally, intact neurologic function along the median, ulnar, radial nerve distributions in the left upper extremity  Skin:    General: Skin is warm and dry.  Neurological:     Mental Status: She is alert.     Comments: Cranial nerves II through XII grossly intact.  Moving all 4 extremities spontaneously.  Sensation grossly intact all 4 extremities     ED Results / Procedures / Treatments   Labs (all labs ordered are listed, but only abnormal results are displayed) Labs Reviewed - No data to display  EKG None  Radiology CT Cervical Spine Wo Contrast  Result Date: 05/14/2022 CLINICAL DATA:  Fall EXAM: CT HEAD WITHOUT CONTRAST CT CERVICAL SPINE WITHOUT CONTRAST TECHNIQUE: Multidetector CT imaging of the head and cervical spine was performed following the standard protocol without intravenous contrast. Multiplanar CT image reconstructions of the cervical spine were also generated. RADIATION DOSE REDUCTION: This exam was performed according to the departmental dose-optimization program which includes automated exposure control, adjustment of the mA and/or kV according to patient size and/or use of iterative reconstruction technique. COMPARISON:  Correlation with MRI brain report dated 08/21/2009. FINDINGS: CT HEAD FINDINGS Brain: No evidence of acute infarction, hemorrhage, hydrocephalus, extra-axial collection or mass lesion/mass effect. 2.1  cm calcified meningioma along the anterior falx (series 2/image 24), previously 2.0 cm by report. Mild subcortical white matter and periventricular small vessel ischemic changes. Vascular: Intracranial atherosclerosis. Skull: Normal. Negative for fracture or focal lesion. Sinuses/Orbits: The visualized paranasal sinuses are essentially clear. The mastoid air cells are unopacified. Other: None. CT CERVICAL SPINE FINDINGS Alignment: Normal cervical lordosis. Skull base and vertebrae: No acute fracture. No primary bone lesion or focal pathologic process. Soft tissues and spinal canal: No prevertebral fluid or swelling. No visible canal hematoma. Disc levels: Mild degenerative changes of the mid cervical spine. Spinal canal is patent. Upper chest: Visualized lung apices are clear. Other: Visualized thyroid is unremarkable. IMPRESSION: No acute intracranial abnormality. 2.1 cm calcified meningioma along the anterior falx, grossly unchanged from 2011 by  report. Mild small vessel ischemic changes. No traumatic injury to the cervical spine. Mild degenerative changes. Electronically Signed   By: Charline Bills M.D.   On: 05/14/2022 22:16   CT Head Wo Contrast  Result Date: 05/14/2022 CLINICAL DATA:  Fall EXAM: CT HEAD WITHOUT CONTRAST CT CERVICAL SPINE WITHOUT CONTRAST TECHNIQUE: Multidetector CT imaging of the head and cervical spine was performed following the standard protocol without intravenous contrast. Multiplanar CT image reconstructions of the cervical spine were also generated. RADIATION DOSE REDUCTION: This exam was performed according to the departmental dose-optimization program which includes automated exposure control, adjustment of the mA and/or kV according to patient size and/or use of iterative reconstruction technique. COMPARISON:  Correlation with MRI brain report dated 08/21/2009. FINDINGS: CT HEAD FINDINGS Brain: No evidence of acute infarction, hemorrhage, hydrocephalus, extra-axial collection  or mass lesion/mass effect. 2.1 cm calcified meningioma along the anterior falx (series 2/image 24), previously 2.0 cm by report. Mild subcortical white matter and periventricular small vessel ischemic changes. Vascular: Intracranial atherosclerosis. Skull: Normal. Negative for fracture or focal lesion. Sinuses/Orbits: The visualized paranasal sinuses are essentially clear. The mastoid air cells are unopacified. Other: None. CT CERVICAL SPINE FINDINGS Alignment: Normal cervical lordosis. Skull base and vertebrae: No acute fracture. No primary bone lesion or focal pathologic process. Soft tissues and spinal canal: No prevertebral fluid or swelling. No visible canal hematoma. Disc levels: Mild degenerative changes of the mid cervical spine. Spinal canal is patent. Upper chest: Visualized lung apices are clear. Other: Visualized thyroid is unremarkable. IMPRESSION: No acute intracranial abnormality. 2.1 cm calcified meningioma along the anterior falx, grossly unchanged from 2011 by report. Mild small vessel ischemic changes. No traumatic injury to the cervical spine. Mild degenerative changes. Electronically Signed   By: Charline Bills M.D.   On: 05/14/2022 22:16   DG Shoulder Left  Result Date: 05/14/2022 CLINICAL DATA:  Fall, left shoulder pain EXAM: LEFT SHOULDER - 2+ VIEW COMPARISON:  None Available. FINDINGS: Normal alignment. No acute fracture or dislocation. Mild acromioclavicular degenerative arthritis. Glenohumeral joint space is not well profiled though at least mild degenerative changes are noted. Limited evaluation of the left hemithorax is unremarkable. IMPRESSION: 1. Mild degenerative change. No acute fracture or dislocation. Electronically Signed   By: Helyn Numbers M.D.   On: 05/14/2022 20:36    Procedures Procedures    Medications Ordered in ED Medications - No data to display  ED Course/ Medical Decision Making/ A&P                             Medical Decision Making Amount  and/or Complexity of Data Reviewed Radiology: ordered.  Risk Prescription drug management.    89 year old female with medical history significant for HLD, HTN, chronic back pain who presents to the emergency department after a ground-level mechanical fall.  The patient states that she was trying to put her pants on when she lost her balance and fell onto her left shoulder.  She denies any loss of consciousness.  She not clearly hit her head from what she can remember but is not entirely clear.  She is not on anticoagulation.  She states that she has had pain in her left shoulder with range of motion since the fall.  She is worried she may have injured her shoulder.  She is not taking anything for pain.  She arrives GCS 15, ABC intact.  On arrival, the patient was afebrile, not tachycardic or tachypneic,  mildly hypertensive BP 184/95, saturating 97% on room air.  Patient presents with a mechanical fall.  Did have some midline tenderness on exam.  CT imaging of the head and cervical spine was performed which revealed no evidence of traumatic injury.  Patient has a known meningioma which has not grown in size since 2011.  X-ray imaging was performed of the left shoulder.  CT Head and Cervical Spine: IMPRESSION:  No acute intracranial abnormality. 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 Mild small  vessel ischemic changes.    No traumatic injury to the cervical spine. Mild degenerative  changes.    XR Left Shoulder: Negative IMPRESSION:  1. Mild degenerative change. No acute fracture or dislocation.    Pt states that she has a sling at home to use. She plans to follow-up outpatient with an Orthopedist at [place]. Advised rest, ICE, tylenol and NSAIDs for pain control, sling for comfort. Stable for discharge.    Final Clinical Impression(s) / ED Diagnoses Final diagnoses:  Fall, initial encounter  Injury of left shoulder, initial  encounter  Meningioma    Rx / DC Orders ED Discharge Orders          Ordered    naproxen (NAPROSYN) 375 MG tablet  2 times daily        05/14/22 2250              Ernie Avena, MD 05/14/22 2252

## 2022-05-14 NOTE — Discharge Instructions (Addendum)
Your x-ray imaging was negative for dislocation or fracture.  You have no tenderness about the [facility] joint.  Your symptoms could be consistent with a ligamentous injury of the shoulder.  Recommend you follow-up outpatient with your orthopedist.  Recommend Tylenol and ibuprofen for pain control.  CT Head and Cervical Spine: IMPRESSION:  No acute intracranial abnormality. 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 Mild small  vessel ischemic changes.    No traumatic injury to the cervical spine. Mild degenerative  changes.

## 2024-02-29 ENCOUNTER — Emergency Department (HOSPITAL_BASED_OUTPATIENT_CLINIC_OR_DEPARTMENT_OTHER)

## 2024-02-29 ENCOUNTER — Other Ambulatory Visit: Payer: Self-pay

## 2024-02-29 ENCOUNTER — Inpatient Hospital Stay (HOSPITAL_BASED_OUTPATIENT_CLINIC_OR_DEPARTMENT_OTHER)
Admission: EM | Admit: 2024-02-29 | Discharge: 2024-03-03 | DRG: 390 | Disposition: A | Attending: Internal Medicine | Admitting: Internal Medicine

## 2024-02-29 ENCOUNTER — Encounter (HOSPITAL_BASED_OUTPATIENT_CLINIC_OR_DEPARTMENT_OTHER): Payer: Self-pay

## 2024-02-29 DIAGNOSIS — K56600 Partial intestinal obstruction, unspecified as to cause: Secondary | ICD-10-CM | POA: Diagnosis present

## 2024-02-29 DIAGNOSIS — Z882 Allergy status to sulfonamides status: Secondary | ICD-10-CM | POA: Diagnosis not present

## 2024-02-29 DIAGNOSIS — Z888 Allergy status to other drugs, medicaments and biological substances status: Secondary | ICD-10-CM | POA: Diagnosis not present

## 2024-02-29 DIAGNOSIS — K219 Gastro-esophageal reflux disease without esophagitis: Secondary | ICD-10-CM | POA: Diagnosis present

## 2024-02-29 DIAGNOSIS — Z7982 Long term (current) use of aspirin: Secondary | ICD-10-CM

## 2024-02-29 DIAGNOSIS — Z90710 Acquired absence of both cervix and uterus: Secondary | ICD-10-CM

## 2024-02-29 DIAGNOSIS — Z79899 Other long term (current) drug therapy: Secondary | ICD-10-CM

## 2024-02-29 DIAGNOSIS — K56609 Unspecified intestinal obstruction, unspecified as to partial versus complete obstruction: Secondary | ICD-10-CM | POA: Diagnosis present

## 2024-02-29 DIAGNOSIS — E785 Hyperlipidemia, unspecified: Secondary | ICD-10-CM | POA: Diagnosis present

## 2024-02-29 DIAGNOSIS — I10 Essential (primary) hypertension: Secondary | ICD-10-CM | POA: Diagnosis present

## 2024-02-29 DIAGNOSIS — K50012 Crohn's disease of small intestine with intestinal obstruction: Principal | ICD-10-CM

## 2024-02-29 DIAGNOSIS — Z881 Allergy status to other antibiotic agents status: Secondary | ICD-10-CM

## 2024-02-29 LAB — HEPATIC FUNCTION PANEL
ALT: 30 U/L (ref 0–44)
AST: 26 U/L (ref 15–41)
Albumin: 4.8 g/dL (ref 3.5–5.0)
Alkaline Phosphatase: 90 U/L (ref 38–126)
Bilirubin, Direct: 0.3 mg/dL — ABNORMAL HIGH (ref 0.0–0.2)
Indirect Bilirubin: 0.4 mg/dL (ref 0.3–0.9)
Total Bilirubin: 0.7 mg/dL (ref 0.0–1.2)
Total Protein: 8 g/dL (ref 6.5–8.1)

## 2024-02-29 LAB — BASIC METABOLIC PANEL WITH GFR
Anion gap: 16 — ABNORMAL HIGH (ref 5–15)
BUN: 24 mg/dL — ABNORMAL HIGH (ref 8–23)
CO2: 25 mmol/L (ref 22–32)
Calcium: 10.2 mg/dL (ref 8.9–10.3)
Chloride: 103 mmol/L (ref 98–111)
Creatinine, Ser: 1.21 mg/dL — ABNORMAL HIGH (ref 0.44–1.00)
GFR, Estimated: 43 mL/min — ABNORMAL LOW
Glucose, Bld: 160 mg/dL — ABNORMAL HIGH (ref 70–99)
Potassium: 3.9 mmol/L (ref 3.5–5.1)
Sodium: 144 mmol/L (ref 135–145)

## 2024-02-29 LAB — CBC
HCT: 48.8 % — ABNORMAL HIGH (ref 36.0–46.0)
Hemoglobin: 15.3 g/dL — ABNORMAL HIGH (ref 12.0–15.0)
MCH: 25.9 pg — ABNORMAL LOW (ref 26.0–34.0)
MCHC: 31.4 g/dL (ref 30.0–36.0)
MCV: 82.6 fL (ref 80.0–100.0)
Platelets: 353 10*3/uL (ref 150–400)
RBC: 5.91 MIL/uL — ABNORMAL HIGH (ref 3.87–5.11)
RDW: 14.6 % (ref 11.5–15.5)
WBC: 13.6 10*3/uL — ABNORMAL HIGH (ref 4.0–10.5)
nRBC: 0 % (ref 0.0–0.2)

## 2024-02-29 LAB — LIPASE, BLOOD: Lipase: 33 U/L (ref 11–51)

## 2024-02-29 LAB — TROPONIN T, HIGH SENSITIVITY: Troponin T High Sensitivity: 13 ng/L (ref 0–19)

## 2024-02-29 MED ORDER — PANTOPRAZOLE SODIUM 40 MG IV SOLR
40.0000 mg | Freq: Every day | INTRAVENOUS | Status: DC
  Administered 2024-03-01: 40 mg via INTRAVENOUS
  Filled 2024-02-29: qty 10

## 2024-02-29 MED ORDER — SODIUM CHLORIDE 0.9 % IV BOLUS
500.0000 mL | Freq: Once | INTRAVENOUS | Status: AC
  Administered 2024-02-29: 500 mL via INTRAVENOUS

## 2024-02-29 MED ORDER — HEPARIN SODIUM (PORCINE) 5000 UNIT/ML IJ SOLN
5000.0000 [IU] | Freq: Three times a day (TID) | INTRAMUSCULAR | Status: DC
  Filled 2024-02-29: qty 1

## 2024-02-29 MED ORDER — ONDANSETRON HCL 4 MG PO TABS: 4.0000 mg | ORAL_TABLET | Freq: Four times a day (QID) | ORAL | Status: DC | PRN

## 2024-02-29 MED ORDER — ALUM & MAG HYDROXIDE-SIMETH 200-200-20 MG/5ML PO SUSP
30.0000 mL | Freq: Once | ORAL | Status: AC
  Administered 2024-02-29: 30 mL via ORAL
  Filled 2024-02-29: qty 30

## 2024-02-29 MED ORDER — ACETAMINOPHEN 650 MG RE SUPP: 650.0000 mg | Freq: Four times a day (QID) | RECTAL | Status: DC | PRN

## 2024-02-29 MED ORDER — HYDRALAZINE HCL 20 MG/ML IJ SOLN: 5.0000 mg | Freq: Four times a day (QID) | INTRAMUSCULAR | Status: DC | PRN

## 2024-02-29 MED ORDER — IOHEXOL 300 MG/ML  SOLN
80.0000 mL | Freq: Once | INTRAMUSCULAR | Status: AC | PRN
  Administered 2024-02-29: 80 mL via INTRAVENOUS

## 2024-02-29 MED ORDER — MORPHINE SULFATE (PF) 2 MG/ML IV SOLN: 2.0000 mg | INTRAVENOUS | Status: AC | PRN

## 2024-02-29 MED ORDER — PANTOPRAZOLE SODIUM 40 MG IV SOLR
40.0000 mg | Freq: Once | INTRAVENOUS | Status: AC
  Administered 2024-02-29: 40 mg via INTRAVENOUS
  Filled 2024-02-29: qty 10

## 2024-02-29 MED ORDER — ONDANSETRON HCL 4 MG/2ML IJ SOLN: 4.0000 mg | Freq: Four times a day (QID) | INTRAMUSCULAR | Status: DC | PRN

## 2024-02-29 MED ORDER — ACETAMINOPHEN 10 MG/ML IV SOLN: 1000.0000 mg | Freq: Four times a day (QID) | INTRAVENOUS | Status: AC | PRN

## 2024-02-29 MED ORDER — LACTATED RINGERS IV SOLN: INTRAVENOUS | Status: AC

## 2024-02-29 MED ORDER — ACETAMINOPHEN 325 MG PO TABS: 650.0000 mg | ORAL_TABLET | Freq: Four times a day (QID) | ORAL | Status: DC | PRN

## 2024-02-29 NOTE — Assessment & Plan Note (Signed)
-  Protonix 40 mg IV daily

## 2024-02-29 NOTE — ED Triage Notes (Signed)
 Pt states that she had some chest pain that started this morning at 1am. States that she ate soup late and didn't take her pepcid. States that she did vomit and that helped.

## 2024-02-29 NOTE — Assessment & Plan Note (Addendum)
 Home lisinopril  10 mg daily not resumed on admission Hydralazine  5 mg IV every 6 hours as needed for SBP > 165, 5 days ordered

## 2024-02-29 NOTE — ED Notes (Signed)
 Patient transported to CT

## 2024-02-29 NOTE — Hospital Course (Addendum)
 Ms. Shehan was admitted to the hospital with the working diagnosis of small bowel obstruction   89 year old female with history of hypertension, hyperlipidemia, GERD, neuropathy, who presents emergency department for chief concerns of epigastric chest pain/abdominal pain and discomfort that is relieved with vomiting. Acute onset of symptoms, started on the day of hospitalization around 1 am.  On her initial physical examination her temperature was 98.3, respiration rate of 13, heart rate 79, blood pressure 149/52, SpO2 96% on room air. Lungs with no wheezing or rhonchi, heart with S1 and S2 and rhythmic, abdomen with no distention, soft and non tender, no lower extremity edema.   Na 144, K 3,9 Cl 103 bicarbonate 25 glucose 160 bun 24 cr 1,21 High sensitive troponin 13 Wbc 13.6 hgb 15.3 plt 353  Chest radiograph with hyperinflation with no cardiomegaly, effusions or infiltrates.   EKG 84 bpm, normal axis, normal intervals, qtc 441, sinus rhythm with no significant ST segment or T wave changes.   CT abdomen and pelvis with terminal ileitis with several loops of ileum proximally noted be dilatated up to 3.1 cm with associated fecalized material and fluid within the ileum. Developing partial or complete bowel obstruction not excluded in this setting of terminal ileitis.   Patient was placed on pantoprazole  40 mg IV and IV fluids.

## 2024-02-29 NOTE — ED Provider Notes (Signed)
 " [facility] Provider Note   CSN: [id number] Arrival date & time: 02/29/24  1115     Patient presents with: Chest Pain   Debbie Sloan is a 89 y.o. female.   Patient is a 89 year old female who presents with abdominal and chest pain.  She has a history of hypertension and hyperlipidemia.  She also has a history of GERD.  She said she ate some vegetables soup last night without taking her antacid medication.  She started getting some pain in her epigastric area about 1 or 2 AM.  She said it radiates up into her chest.  It persisted through the night.  She had 1 episode of vomiting this morning and felt a little better although she still has some indigestion sensation up into her chest.  She denies any ongoing vomiting.  No fevers.  No shortness of breath.  No leg pain or swelling.  The pain does not radiate to her back, neck or shoulders.  She has little bit of vague discomfort in her right upper chest/axilla area.  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  She does not have any pleuritic pain.       Prior to Admission medications  Medication Sig Start Date End Date Taking? Authorizing Provider  acetaminophen  (TYLENOL ) 500 MG tablet Take 1,000 mg by mouth every 6 (six) hours as needed for mild pain.    [provider]  ALPRAZolam (XANAX) 0.5 MG tablet Take 0.25 mg by mouth daily as needed for anxiety. 07/09/17   [provider]  Ascorbic Acid (VITAMIN C) 500 MG CHEW Chew 500 mg by mouth daily.    [provider]  aspirin EC 81 MG tablet Take 81 mg by mouth at bedtime.     [provider]  Biotin 5000 MCG TABS Take 10,000 mcg by mouth daily.    [provider]  Cholecalciferol (VITAMIN D3) 1000 units CAPS Take 1,000 Units by mouth 2 (two) times daily.     [provider]  gabapentin  (NEURONTIN ) 800 MG tablet Take 400-800 mg by mouth See admin instructions. Take 1/2 tablet (400mg )  in the morning, and 1 tablet (800mg ) in the evening    [provider]  lisinopril  (PRINIVIL ,ZESTRIL ) 10 MG tablet Take 10 mg by mouth daily.    [provider]  magnesium oxide (MAG-OX) 400 MG tablet Take 400 mg by mouth at bedtime.     [provider]  naproxen  (NAPROSYN ) 375 MG tablet Take 1 tablet (375 mg total) by mouth 2 (two) times daily. 05/14/22   Jerrol Agent, MD  Omega-3 Fatty Acids (FISH OIL) 1000 MG CAPS Take 1,000 mg by mouth at bedtime.     [provider]  pravastatin  (PRAVACHOL ) 80 MG tablet Take 80 mg by mouth every evening.  07/10/17   [provider]  ranitidine (ZANTAC) 150 MG tablet Take 150 mg by mouth 2 (two) times daily. 07/25/17   [provider]  tiZANidine (ZANAFLEX) 4 MG tablet Take 2 mg by mouth at bedtime as needed for cramping. 07/27/14   [provider]    Allergies: Demeclocycline, Tetracyclines & related, Atorvastatin, Desvenlafaxine, Lisinopril , Simvastatin, and Sulfa antibiotics    Review of Systems  Constitutional:  Negative for chills, diaphoresis, fatigue and fever.  HENT:  Negative for congestion, rhinorrhea and sneezing.   Eyes: Negative.   Respiratory:  Negative for cough, chest tightness and shortness of breath.   Cardiovascular:  Positive for chest pain. Negative for leg swelling.  Gastrointestinal:  Positive for abdominal pain, constipation, nausea and vomiting. Negative for diarrhea.  Genitourinary:  Negative for difficulty urinating, flank pain and frequency.  Musculoskeletal:  Negative for arthralgias and back pain.  Skin:  Negative for rash.  Neurological:  Negative for dizziness, speech difficulty, weakness, numbness and headaches.    Updated Vital Signs BP (!) 163/59   Pulse 74   Temp 98.3 F (36.8 C)   Resp 15   Ht 5' 4 (1.626 m)   Wt 65 kg   SpO2 99%   BMI 24.60 kg/m   Physical Exam Constitutional:      Appearance: She is well-developed.  HENT:     Head:  Normocephalic and atraumatic.  Eyes:     Pupils: Pupils are equal, round, and reactive to light.  Cardiovascular:     Rate and Rhythm: Normal rate and regular rhythm.     Heart sounds: Normal heart sounds.  Pulmonary:     Effort: Pulmonary effort is normal. No respiratory distress.     Breath sounds: Normal breath sounds. No wheezing or rales.     Comments: No pain in palpation of the right chest wall.  No pain with range of motion of the right arm.  No crepitus or deformity Chest:     Chest wall: No tenderness.  Abdominal:     General: Bowel sounds are normal.     Palpations: Abdomen is soft.     Tenderness: There is abdominal tenderness (Tenderness in the right lower quadrant). There is no guarding or rebound.  Musculoskeletal:        General: Normal range of motion.     Cervical back: Normal range of motion and neck supple.  Lymphadenopathy:     Cervical: No cervical adenopathy.  Skin:    General: Skin is warm and dry.     Findings: No rash.  Neurological:     Mental Status: She is alert and oriented to person, place, and time.     (all labs ordered are listed, but only abnormal results are displayed) Labs Reviewed  BASIC METABOLIC PANEL WITH GFR - Abnormal; Notable for the following components:      Result Value   Glucose, Bld 160 (*)    BUN 24 (*)    Creatinine, Ser 1.21 (*)    GFR, Estimated 43 (*)    Anion gap 16 (*)    All other components within normal limits  CBC - Abnormal; Notable for the following components:   WBC 13.6 (*)    RBC 5.91 (*)    Hemoglobin 15.3 (*)    HCT 48.8 (*)    MCH 25.9 (*)    All other components within normal limits  HEPATIC FUNCTION PANEL - Abnormal; Notable for the following components:   Bilirubin, Direct 0.3 (*)    All other components within normal limits  LIPASE, BLOOD  TROPONIN T, HIGH SENSITIVITY    EKG: EKG Interpretation Date/Time:  Saturday February 29 2024 11:28:59 EST Ventricular Rate:  84 PR Interval:  186 QRS  Duration:  100 QT Interval:  373 QTC Calculation: 441 R Axis:   71  Text Interpretation: Sinus rhythm Minimal ST depression, inferior leads No old tracing to compare Confirmed by Lenor Hollering [phone]) on 02/29/2024 11:39:44 AM  Radiology: [facility] Chest 2 View Result Date: 02/29/2024 EXAM: 2 VIEW(S) XRAY OF THE CHEST 02/29/2024 01:04:19 PM COMPARISON: None available. CLINICAL HISTORY: Chest pain. FINDINGS: LUNGS AND PLEURA:  No focal pulmonary opacity. No pleural effusion. No pneumothorax. HEART AND MEDIASTINUM: Atherosclerotic aorta. No acute abnormality of the cardiac and mediastinal silhouettes. BONES AND SOFT TISSUES: Degenerative changes of spine and shoulders. IMPRESSION: 1. No acute cardiopulmonary pathology. Electronically signed by: Morgane Naveau MD 02/29/2024 01:13 PM EST RP Workstation: [id number]   CT ABDOMEN PELVIS W CONTRAST Result Date: 02/29/2024 EXAM: CT ABDOMEN AND PELVIS WITH CONTRAST 02/29/2024 12:57:15 PM TECHNIQUE: CT of the abdomen and pelvis was performed with the administration of 80 mL of iohexol  (OMNIPAQUE ) 300 MG/ML solution. Multiplanar reformatted images are provided for review. Automated exposure control, iterative reconstruction, and/or weight-based adjustment of the mA/kV was utilized to reduce the radiation dose to as low as reasonably achievable. COMPARISON: None available. CLINICAL HISTORY: RLQ abdominal pain. Right lower quadrant abdominal pain. FINDINGS: LOWER CHEST: Bilateral lower lobe atelectasis. LIVER: Bone density lesions of the left hepatic lobe likely represent simple hepatic cysts. The main portal, splenic, and superior mesenteric veins are patent. GALLBLADDER AND BILE DUCTS: Gallbladder is unremarkable. No biliary ductal dilatation. SPLEEN: No acute abnormality. PANCREAS: No acute abnormality. ADRENAL GLANDS: No acute abnormality. KIDNEYS, URETERS AND BLADDER: Bilateral renal cortical scarring. No hydroureteronephrosis. No nephroureterolithiasis. No renal mass.  No perinephric or periureteral stranding. Urinary bladder is unremarkable. GI AND BOWEL: Stomach demonstrates no acute abnormality. No small or large bowel thickening or dilatation. The appendix is unremarkable. Multiple loops of continuous distal small bowel are dilated with fluid and fecalized material with caliber up to 3.1 cm. No definite transition point noted with dilatation reaching the terminal ileum. The terminal ileum demonstrates slight bowel wall haziness and trace thickening suggestive of possible underlying enteritis. Associated trace right lower quadrant free fluid. PERITONEUM AND RETROPERITONEUM: Associated trace right lower quadrant free fluid. No free air. VASCULATURE: Severe atherosclerotic plaque of the aorta. The main portal, splenic, and superior mesenteric veins are patent. LYMPH NODES: No lymphadenopathy. REPRODUCTIVE ORGANS: No acute abnormality. BONES AND SOFT TISSUES: No acute osseous abnormality. No focal soft tissue abnormality. Multilevel severe degenerative changes of the spine. Dextroscoliosis of the spine centered at the L3 level. IMPRESSION: 1. Developing terminal ileitis with several loops of ileum proximally noted to be dilated up to 3.1 cm with associated fecalized material and fluid within the lumen. Developing partial or complete bowel obstruction not excluded in this setting of terminal ileitis. 2. Other, non-acute and/or normal findings as above. Electronically signed by: Morgane Naveau MD 02/29/2024 01:12 PM EST RP Workstation: [id number]     Procedures   Medications Ordered in the ED  pantoprazole  (PROTONIX ) injection 40 mg (40 mg Intravenous Given 02/29/24 1207)  alum & mag hydroxide-simeth (MAALOX/MYLANTA) 200-200-20 MG/5ML suspension 30 mL (30 mLs Oral Given 02/29/24 1207)  sodium chloride  0.9 % bolus 500 mL (0 mLs Intravenous Stopped 02/29/24 1247)  iohexol  (OMNIPAQUE ) 300 MG/ML solution 80 mL (80 mLs Intravenous Contrast Given 02/29/24 1248)                                     Medical Decision Making Amount and/or Complexity of Data Reviewed Labs: ordered. Radiology: ordered.  Risk OTC drugs. Prescription drug management.   This patient presents to the ED for concern of abdominal pain/chest pain, this involves an extensive number of treatment options, and is a complaint that carries with it a high risk of complications and morbidity.  I considered the following differential and admission for this acute, potentially life threatening condition.  The differential diagnosis includes pancreatitis, cholecystitis, GERD, esophageal spasm, ACS, PE, appendicitis, colitis  MDM:    Patient is a 89 year old who presents with epigastric pain radiating to her chest.  Her EKG does not show any ischemic changes.  Labs show an elevated WBC count, normal troponin, normal LFTs, normal lipase.  CT scan was performed which shows evidence of terminal ileitis with an associated partial small bowel obstruction.  Patient overall is feeling better.  She does not have any ongoing vomiting.  At this point I did not think an NG tube was indicated.  I did discuss the findings with Dr. Burnette with [facility] gastroenterology who does not recommend antibiotics for the terminal ileitis.  He recommends just supportive care.  Discussed with Dr. Sherre with the hospitalist service to admit the patient for further treatment.  (Labs, imaging, consults)  Labs: I Ordered, and personally interpreted labs.  The pertinent results include: Elevated WBC count, normal troponin, normal LFTs, normal lipase, creatinine mildly elevated but similar to prior values  Imaging Studies ordered: I ordered imaging studies including CT abdomen pelvis I independently visualized and interpreted imaging. I agree with the radiologist interpretation  Additional history obtained from grandson at bedside.  External records from outside source obtained and reviewed including prior notes  Cardiac Monitoring: The  patient was maintained on a cardiac monitor.  If on the cardiac monitor, I personally viewed and interpreted the cardiac monitored which showed an underlying rhythm of: Sinus rhythm  Reevaluation: After the interventions noted above, I reevaluated the patient and found that they have :improved  Social Determinants of Health:    Disposition: Admit to hospital  Co morbidities that complicate the patient evaluation  Past Medical History:  Diagnosis Date   Chronic back pain    Hyperlipidemia    Hypertension      Medicines Meds ordered this encounter  Medications   pantoprazole  (PROTONIX ) injection 40 mg   alum & mag hydroxide-simeth (MAALOX/MYLANTA) 200-200-20 MG/5ML suspension 30 mL   sodium chloride  0.9 % bolus 500 mL   iohexol  (OMNIPAQUE ) 300 MG/ML solution 80 mL    I have reviewed the patients home medicines and have made adjustments as needed  Problem List / ED Course: Problem List Items Addressed This Visit   None Visit Diagnoses       Terminal ileitis, with intestinal obstruction (HCC)    -  Primary     SBO (small bowel obstruction) (HCC)                    Final diagnoses:  Terminal ileitis, with intestinal obstruction (HCC)  SBO (small bowel obstruction) [facility])    ED Discharge Orders     None          Lenor Hollering, MD 02/29/24 1419  "

## 2024-02-29 NOTE — Assessment & Plan Note (Signed)
 Resume statin therapy

## 2024-02-29 NOTE — Plan of Care (Signed)

## 2024-03-01 LAB — BASIC METABOLIC PANEL WITH GFR
Anion gap: 9 (ref 5–15)
BUN: 17 mg/dL (ref 8–23)
CO2: 25 mmol/L (ref 22–32)
Calcium: 9.2 mg/dL (ref 8.9–10.3)
Chloride: 109 mmol/L (ref 98–111)
Creatinine, Ser: 1 mg/dL (ref 0.44–1.00)
GFR, Estimated: 54 mL/min — ABNORMAL LOW
Glucose, Bld: 109 mg/dL — ABNORMAL HIGH (ref 70–99)
Potassium: 4 mmol/L (ref 3.5–5.1)
Sodium: 143 mmol/L (ref 135–145)

## 2024-03-01 LAB — CBC
HCT: 39 % (ref 36.0–46.0)
Hemoglobin: 12.3 g/dL (ref 12.0–15.0)
MCH: 26.3 pg (ref 26.0–34.0)
MCHC: 31.5 g/dL (ref 30.0–36.0)
MCV: 83.5 fL (ref 80.0–100.0)
Platelets: 273 10*3/uL (ref 150–400)
RBC: 4.67 MIL/uL (ref 3.87–5.11)
RDW: 14.8 % (ref 11.5–15.5)
WBC: 7.9 10*3/uL (ref 4.0–10.5)
nRBC: 0 % (ref 0.0–0.2)

## 2024-03-01 MED ORDER — AMLODIPINE BESYLATE 5 MG PO TABS
5.0000 mg | ORAL_TABLET | Freq: Every day | ORAL | Status: DC
  Administered 2024-03-01 – 2024-03-03 (×3): 5 mg via ORAL
  Filled 2024-03-01 (×3): qty 1

## 2024-03-01 MED ORDER — ENOXAPARIN SODIUM 40 MG/0.4ML IJ SOSY
40.0000 mg | PREFILLED_SYRINGE | INTRAMUSCULAR | Status: DC
  Administered 2024-03-01 – 2024-03-02 (×2): 40 mg via SUBCUTANEOUS
  Filled 2024-03-01 (×2): qty 0.4

## 2024-03-01 MED ORDER — POLYVINYL ALCOHOL 1.4 % OP SOLN
1.0000 [drp] | OPHTHALMIC | Status: DC | PRN
  Administered 2024-03-01: 1 [drp] via OPHTHALMIC
  Filled 2024-03-01: qty 15

## 2024-03-01 MED ORDER — PRAVASTATIN SODIUM 20 MG PO TABS
80.0000 mg | ORAL_TABLET | Freq: Every evening | ORAL | Status: DC
  Administered 2024-03-01 – 2024-03-02 (×2): 80 mg via ORAL
  Filled 2024-03-01 (×2): qty 4

## 2024-03-01 MED ORDER — BISACODYL 10 MG RE SUPP
10.0000 mg | Freq: Every day | RECTAL | Status: AC
  Administered 2024-03-01: 10 mg via RECTAL
  Filled 2024-03-01 (×2): qty 1

## 2024-03-01 MED ORDER — ALLOPURINOL 100 MG PO TABS
50.0000 mg | ORAL_TABLET | Freq: Every day | ORAL | Status: DC
  Administered 2024-03-02 – 2024-03-03 (×2): 50 mg via ORAL
  Filled 2024-03-01 (×3): qty 1

## 2024-03-01 NOTE — Progress Notes (Signed)
" °  Progress Note   Patient: Debbie Sloan FMW:[id number] DOB: [birth year] DOA: 02/29/2024     1 DOS: the patient was seen and examined on 03/01/2024   Brief hospital course: Debbie Sloan was admitted to the hospital with the working diagnosis of small bowel obstruction   89 year old female with history of hypertension, hyperlipidemia, GERD, neuropathy, who presents emergency department for chief concerns of epigastric chest pain/abdominal pain and discomfort that is relieved with vomiting. Acute onset of symptoms, started on the day of hospitalization around 1 am.  On her initial physical examination her temperature was 98.3, respiration rate of 13, heart rate 79, blood pressure 149/52, SpO2 96% on room air. Lungs with no wheezing or rhonchi, heart with S1 and S2 and rhythmic, abdomen with no distention, soft and non tender, no lower extremity edema.   Na 144, K 3,9 Cl 103 bicarbonate 25 glucose 160 bun 24 cr 1,21 High sensitive troponin 13 Wbc 13.6 hgb 15.3 plt 353  Chest radiograph with hyperinflation with no cardiomegaly, effusions or infiltrates.   EKG 84 bpm, normal axis, normal intervals, qtc 441, sinus rhythm with no significant ST segment or T wave changes.   CT abdomen and pelvis with terminal ileitis with several loops of ileum proximally noted be dilatated up to 3.1 cm with associated fecalized material and fluid within the ileum. Developing partial or complete bowel obstruction not excluded in this setting of terminal ileitis.   Patient was placed on pantoprazole  40 mg IV and IV fluids.   Assessment and Plan: * Partial small bowel obstruction (HCC) Terminal ileitis.   Plan to continue supportive medical therapy Continue antiacid therapy On clear liquids today, hold on IV fluids for now  Bisacodyl  today for 2 doses.  Follow up with GI recommendations   Primary hypertension Blood pressure has been stable.  Will resume blood pressure control with amlodipine , will start with 5 mg  daily Patient not further taking lisinopril  at home    GERD (gastroesophageal reflux disease) Protonix  40 mg IV daily  Hyperlipidemia Resume statin therapy       Subjective: Patient with no chest pain and no dyspnea, no nausea or vomiting, no flatus or bowel movement   Physical Exam: Vitals:   02/29/24 1822 02/29/24 2211 03/01/24 0221 03/01/24 1350  BP:  (!) 147/53 (!) 143/57 (!) 143/56  Pulse:  68 61 61  Resp:  16 16 17   Temp:  98.7 F (37.1 C) 97.9 F (36.6 C) 98.8 F (37.1 C)  TempSrc: Oral Oral Oral Oral  SpO2:  95% 94% 96%  Weight: 62.6 kg     Height: 5' 3 (1.6 m)      Neurology awake and alert ENT with mild pallor with no icterus Cardiovascular with S1 and S2 present and regular with no gallops or rubs Respiratory with no rales or wheezing Abdomen with mild distention, no tympanic to percussion, not tender to superficial palpation, soft No lower extremity edema  Data Reviewed:    Family Communication: no family at the bedside   Disposition: Status is: Inpatient Remains inpatient appropriate because: medical therapy   Planned Discharge Destination: Home     Author: Elidia Toribio Furnace, MD 03/01/2024 2:43 PM  For on call review [email or web address].  "

## 2024-03-01 NOTE — Assessment & Plan Note (Addendum)
 Terminal ileitis.   Plan to continue supportive medical therapy Continue antiacid therapy On clear liquids today, hold on IV fluids for now  Bisacodyl  today for 2 doses.  Follow up with GI recommendations

## 2024-03-02 DIAGNOSIS — K56600 Partial intestinal obstruction, unspecified as to cause: Principal | ICD-10-CM

## 2024-03-02 LAB — CBC WITH DIFFERENTIAL/PLATELET
Abs Immature Granulocytes: 0.02 10*3/uL (ref 0.00–0.07)
Basophils Absolute: 0.1 10*3/uL (ref 0.0–0.1)
Basophils Relative: 1 %
Eosinophils Absolute: 0.5 10*3/uL (ref 0.0–0.5)
Eosinophils Relative: 6 %
HCT: 44.2 % (ref 36.0–46.0)
Hemoglobin: 13.5 g/dL (ref 12.0–15.0)
Immature Granulocytes: 0 %
Lymphocytes Relative: 20 %
Lymphs Abs: 1.7 10*3/uL (ref 0.7–4.0)
MCH: 25.8 pg — ABNORMAL LOW (ref 26.0–34.0)
MCHC: 30.5 g/dL (ref 30.0–36.0)
MCV: 84.5 fL (ref 80.0–100.0)
Monocytes Absolute: 0.9 10*3/uL (ref 0.1–1.0)
Monocytes Relative: 11 %
Neutro Abs: 5.1 10*3/uL (ref 1.7–7.7)
Neutrophils Relative %: 62 %
Platelets: 285 10*3/uL (ref 150–400)
RBC: 5.23 MIL/uL — ABNORMAL HIGH (ref 3.87–5.11)
RDW: 14.6 % (ref 11.5–15.5)
WBC: 8.2 10*3/uL (ref 4.0–10.5)
nRBC: 0 % (ref 0.0–0.2)

## 2024-03-02 LAB — BASIC METABOLIC PANEL WITH GFR
Anion gap: 8 (ref 5–15)
BUN: 13 mg/dL (ref 8–23)
CO2: 28 mmol/L (ref 22–32)
Calcium: 9.5 mg/dL (ref 8.9–10.3)
Chloride: 107 mmol/L (ref 98–111)
Creatinine, Ser: 0.96 mg/dL (ref 0.44–1.00)
GFR, Estimated: 56 mL/min — ABNORMAL LOW
Glucose, Bld: 108 mg/dL — ABNORMAL HIGH (ref 70–99)
Potassium: 3.8 mmol/L (ref 3.5–5.1)
Sodium: 143 mmol/L (ref 135–145)

## 2024-03-02 MED ORDER — PANTOPRAZOLE SODIUM 40 MG PO TBEC
40.0000 mg | DELAYED_RELEASE_TABLET | Freq: Two times a day (BID) | ORAL | Status: DC
  Administered 2024-03-02 – 2024-03-03 (×3): 40 mg via ORAL
  Filled 2024-03-02 (×3): qty 1

## 2024-03-02 NOTE — Progress Notes (Signed)
 Mobility Specialist - Progress Note:   03/02/24 1053  Mobility  Activity Ambulated independently  Level of Assistance Independent  Assistive Device None  Distance Ambulated (ft) 850 ft  Activity Response Tolerated well  Mobility Referral Yes  Mobility visit 1 Mobility  Mobility Specialist Start Time (ACUTE ONLY) [id number]  Mobility Specialist Stop Time (ACUTE ONLY) 0940  Mobility Specialist Time Calculation (min) (ACUTE ONLY) 18 min   Pt was received in bed and agreed to mobility. No complaints/issues during ambulation. Returned to bed with all needs met. Call bell in reach.  [employer] - Mobility Specialist - Acute Rehabilitation Can be reached via [employer]

## 2024-03-02 NOTE — Plan of Care (Signed)
   Problem: Clinical Measurements: Goal: Will remain free from infection Outcome: Progressing Goal: Diagnostic test results will improve Outcome: Progressing

## 2024-03-03 DIAGNOSIS — K56600 Partial intestinal obstruction, unspecified as to cause: Secondary | ICD-10-CM | POA: Diagnosis not present

## 2024-03-03 MED ORDER — POLYETHYLENE GLYCOL 3350 17 G PO PACK: 17.0000 g | PACK | Freq: Every day | ORAL | 0 refills | Status: AC

## 2024-03-03 NOTE — Plan of Care (Signed)
 ?  Problem: Clinical Measurements: ?Goal: Ability to maintain clinical measurements within normal limits will improve ?Outcome: Progressing ?Goal: Will remain free from infection ?Outcome: Progressing ?Goal: Diagnostic test results will improve ?Outcome: Progressing ?  ?

## 2024-03-03 NOTE — Progress Notes (Signed)
 Debbie Sloan 1:35 PM  Subjective: Patient doing well no abdominal pain moving her bowels eating fine walking the halls probably to go home later today  Objective: Vital signs stable afebrile no acute distress abdomen is soft nontender no new labs or x-rays  Assessment: Resolved partial small bowel obstruction probably due to adhesions in patient with chronic constipation  Plan: Follow-up with her [place] primary GI please call me if I could be of any further assistance with this hospital stay consideration as an outpatient of trying Amitiza which may be cheaper than the Linzess they called in or her primary gastroenterologist can provide samples if needed  [facility]  office [phone] After 5PM or if no answer call [phone]

## 2024-03-03 NOTE — Progress Notes (Signed)
 Mobility Specialist - Progress Note:   03/03/24 1005  Mobility  Activity Ambulated independently  Level of Assistance Independent  Assistive Device None  Distance Ambulated (ft) 650 ft  Activity Response Tolerated well  Mobility Referral Yes  Mobility visit 1 Mobility  Mobility Specialist Start Time (ACUTE ONLY) 0901  Mobility Specialist Stop Time (ACUTE ONLY) 0910  Mobility Specialist Time Calculation (min) (ACUTE ONLY) 9 min   Pt was received in bed and agreed to mobility. No complaints/issues during ambulation. Returned to bed with all needs met. Call bell in reach.  [employer] - Mobility Specialist - Acute Rehabilitation Can be reached via [employer]

## 2024-03-03 NOTE — Progress Notes (Signed)
 Nurse reviewed discharge instructions with pt.  Pt verbalized understanding of discharge instructions and new medications.  No concerns at time of discharge and all questions answered.
